# Patient Record
Sex: Male | Born: 1970 | Race: Black or African American | Hispanic: No | Marital: Married | Smoking: Current every day smoker
Health system: Southern US, Community
[De-identification: ages and names within clinical notes are randomized; demographics above are authoritative.]

## PROBLEM LIST (undated history)

## (undated) HISTORY — PX: KNEE SURGERY: SHX244

---

## 2012-06-21 ENCOUNTER — Encounter (HOSPITAL_COMMUNITY): Payer: Self-pay | Admitting: Emergency Medicine

## 2012-06-21 ENCOUNTER — Emergency Department (HOSPITAL_COMMUNITY): Payer: Private Health Insurance - Indemnity

## 2012-06-21 ENCOUNTER — Emergency Department (HOSPITAL_COMMUNITY)
Admission: EM | Admit: 2012-06-21 | Discharge: 2012-06-21 | Disposition: A | Discharge status: Home / Self Care | Payer: Private Health Insurance - Indemnity | Attending: Emergency Medicine | Admitting: Emergency Medicine

## 2012-06-21 DIAGNOSIS — F17200 Nicotine dependence, unspecified, uncomplicated: Secondary | ICD-10-CM | POA: Insufficient documentation

## 2012-06-21 DIAGNOSIS — X500XXA Overexertion from strenuous movement or load, initial encounter: Secondary | ICD-10-CM | POA: Insufficient documentation

## 2012-06-21 DIAGNOSIS — S93409A Sprain of unspecified ligament of unspecified ankle, initial encounter: Secondary | ICD-10-CM | POA: Insufficient documentation

## 2012-06-21 DIAGNOSIS — Y998 Other external cause status: Secondary | ICD-10-CM | POA: Insufficient documentation

## 2012-06-21 DIAGNOSIS — Y9367 Activity, basketball: Secondary | ICD-10-CM | POA: Insufficient documentation

## 2012-06-21 MED ORDER — OXYCODONE-ACETAMINOPHEN 5-325 MG PO TABS
2.0000 | ORAL_TABLET | Freq: Once | ORAL | Status: AC
  Administered 2012-06-21: 2 via ORAL
  Filled 2012-06-21: qty 2

## 2012-06-21 MED ORDER — TRAMADOL HCL 50 MG PO TABS: 50.0000 mg | ORAL_TABLET | Freq: Four times a day (QID) | ORAL | Status: AC | PRN

## 2012-06-21 NOTE — ED Provider Notes (Signed)
History     CSN: [id number]  Arrival date & time 06/21/12  4098   First MD Initiated Contact with Patient 06/21/12 [phone]      Chief Complaint  Patient presents with  . Ankle Pain    (Consider location/radiation/quality/duration/timing/severity/associated sxs/prior treatment) HPI  41 y/o Male in no acute distress complaining of pain and paresthesia to left ankle status post rolling the ankle 4 days ago while playing basketball. Patient has difficulty ambulating, urine at this point using crutches. pain is 10 out of 10 not relieved by Tylenol.   History reviewed. No pertinent past medical history.  History reviewed. No pertinent past surgical history.  History reviewed. No pertinent family history.  History  Substance Use Topics  . Smoking status: Current Everyday Smoker -- 0.5 packs/day for 10 years    Types: Cigarettes  . Smokeless tobacco: Not on file  . Alcohol Use: 2.4 oz/week    4 Cans of beer per week      Review of Systems  Musculoskeletal: Positive for joint swelling.  All other systems reviewed and are negative.    Allergies  Ibuprofen  Home Medications   Current Outpatient Rx  Name Route Sig Dispense Refill  . ACETAMINOPHEN 500 MG PO TABS Oral Take 500 mg by mouth every 6 (six) hours as needed. pain      BP 132/75  Pulse 82  Temp 98.3 F (36.8 C) (Oral)  Resp 20  SpO2 100%  Physical Exam  Nursing note and vitals reviewed. Constitutional: He is oriented to person, place, and time. He appears well-developed and well-nourished. No distress.  HENT:  Head: Normocephalic.  Eyes: Conjunctivae and EOM are normal.  Cardiovascular: Normal rate.   Pulmonary/Chest: Effort normal.  Musculoskeletal: Normal range of motion. He exhibits edema.       Swelling to the left lateral malleolus dorsalis pedis 2+ bilaterally. Distal sensation intact to pinprick and soft touch.  Neurological: He is alert and oriented to person, place, and time.  Psychiatric: He  has a normal mood and affect.    ED Course  Procedures (including critical care time)  Labs Reviewed - No data to display Dg Ankle Complete Left  06/21/2012  *RADIOLOGY REPORT*  Clinical Data: Basketball injury, ankle pain/swelling  LEFT ANKLE COMPLETE - 3+ VIEW  Comparison: None.  Findings: No fracture or dislocation is seen.  Degenerative changes of the tibiotalar joint, possibly related to prior trauma.  The ankle mortise is intact.  The base of the fifth metatarsal is unremarkable.  Mild soft tissue swelling, more prominent laterally.  IMPRESSION: No fracture or dislocation is seen.  Degenerative changes of the tibiotalar joint.  Mild soft tissue swelling.  Original Report Authenticated By: Charline Bills, M.D.     1. Ankle sprain       MDM  Male presenting with ankle pain and swelling after rolling the ankle 4 days ago while playing basketball. Pain is not improving he is now ambulating with crutches. To rule out fracture and control pain with Percocet.  No Fracture noted on XR. I will give Pt a new set of crutches as his are without padding. I will encourage elevation and ace-wrap to decrease swelling. I will give ortho follow up with Dr. Ave Filter.    Pt verbalized understanding and agrees with care plan. Outpatient follow-up and return precautions given.         Wynetta Emery, PA-C 06/21/12 [phone]

## 2012-06-21 NOTE — ED Notes (Signed)
Patient advises that he was playing basketball and he jumped up and when he came down he rolled his left ankle. Positive PMS edema present patient advises that he can not bear any weight on his left foot

## 2012-06-21 NOTE — ED Notes (Signed)
Patient discharged with wife using the teach back method patient and wife verbalizes an understanding.

## 2012-06-21 NOTE — ED Provider Notes (Signed)
Medical screening examination/treatment/procedure(s) were performed by non-physician practitioner and as supervising physician I was immediately available for consultation/collaboration.   Gwyneth Sprout, MD 06/21/12 1246

## 2012-06-21 NOTE — ED Notes (Signed)
ED provider in to see patient. °

## 2012-06-21 NOTE — Progress Notes (Signed)
Orthopedic Tech Progress Note Patient Details:  Mathew Craig 1971-02-26 [id number]  Ortho Devices Type of Ortho Device: Crutches;Ace wrap Ortho Device/Splint Interventions: Application   Cammer, Mickie Bail 06/21/2012, 9:37 AM

## 2012-06-21 NOTE — ED Notes (Signed)
Patient transported to X-ray 

## 2012-07-24 ENCOUNTER — Other Ambulatory Visit: Payer: Self-pay | Admitting: Podiatry

## 2012-07-24 DIAGNOSIS — M79672 Pain in left foot: Secondary | ICD-10-CM

## 2012-07-24 DIAGNOSIS — M25572 Pain in left ankle and joints of left foot: Secondary | ICD-10-CM

## 2012-07-30 ENCOUNTER — Inpatient Hospital Stay (HOSPITAL_COMMUNITY): Admission: RE | Admit: 2012-07-30 | Payer: Private Health Insurance - Indemnity | Source: Ambulatory Visit

## 2012-07-30 ENCOUNTER — Ambulatory Visit (HOSPITAL_COMMUNITY): Admission: RE | Admit: 2012-07-30 | Payer: Private Health Insurance - Indemnity | Source: Ambulatory Visit

## 2012-07-30 ENCOUNTER — Other Ambulatory Visit (HOSPITAL_COMMUNITY): Payer: Private Health Insurance - Indemnity

## 2012-08-01 ENCOUNTER — Ambulatory Visit (HOSPITAL_COMMUNITY)
Admission: RE | Admit: 2012-08-01 | Discharge: 2012-08-01 | Disposition: A | Discharge status: Home / Self Care | Payer: Private Health Insurance - Indemnity | Source: Ambulatory Visit | Attending: Podiatry | Admitting: Podiatry

## 2012-08-01 DIAGNOSIS — M25579 Pain in unspecified ankle and joints of unspecified foot: Secondary | ICD-10-CM | POA: Insufficient documentation

## 2012-08-01 DIAGNOSIS — X58XXXA Exposure to other specified factors, initial encounter: Secondary | ICD-10-CM | POA: Insufficient documentation

## 2012-08-01 DIAGNOSIS — M79609 Pain in unspecified limb: Secondary | ICD-10-CM | POA: Insufficient documentation

## 2012-08-01 DIAGNOSIS — M25476 Effusion, unspecified foot: Secondary | ICD-10-CM | POA: Insufficient documentation

## 2012-08-01 DIAGNOSIS — M25473 Effusion, unspecified ankle: Secondary | ICD-10-CM | POA: Insufficient documentation

## 2012-08-01 DIAGNOSIS — S93699A Other sprain of unspecified foot, initial encounter: Secondary | ICD-10-CM | POA: Insufficient documentation

## 2012-08-01 DIAGNOSIS — M659 Synovitis and tenosynovitis, unspecified: Secondary | ICD-10-CM | POA: Insufficient documentation

## 2012-08-01 DIAGNOSIS — M65979 Unspecified synovitis and tenosynovitis, unspecified ankle and foot: Secondary | ICD-10-CM | POA: Insufficient documentation

## 2012-08-01 DIAGNOSIS — R609 Edema, unspecified: Secondary | ICD-10-CM | POA: Insufficient documentation

## 2012-09-12 ENCOUNTER — Encounter (HOSPITAL_COMMUNITY): Payer: Self-pay | Admitting: *Deleted

## 2012-09-12 ENCOUNTER — Emergency Department (HOSPITAL_COMMUNITY)
Admission: EM | Admit: 2012-09-12 | Discharge: 2012-09-12 | Disposition: A | Discharge status: Home / Self Care | Payer: Private Health Insurance - Indemnity | Attending: Emergency Medicine | Admitting: Emergency Medicine

## 2012-09-12 ENCOUNTER — Emergency Department (HOSPITAL_COMMUNITY): Payer: Private Health Insurance - Indemnity

## 2012-09-12 DIAGNOSIS — R51 Headache: Secondary | ICD-10-CM | POA: Insufficient documentation

## 2012-09-12 DIAGNOSIS — S01309A Unspecified open wound of unspecified ear, initial encounter: Secondary | ICD-10-CM | POA: Insufficient documentation

## 2012-09-12 DIAGNOSIS — Z23 Encounter for immunization: Secondary | ICD-10-CM | POA: Insufficient documentation

## 2012-09-12 DIAGNOSIS — W1809XA Striking against other object with subsequent fall, initial encounter: Secondary | ICD-10-CM | POA: Insufficient documentation

## 2012-09-12 DIAGNOSIS — W19XXXA Unspecified fall, initial encounter: Secondary | ICD-10-CM

## 2012-09-12 DIAGNOSIS — S01319A Laceration without foreign body of unspecified ear, initial encounter: Secondary | ICD-10-CM

## 2012-09-12 DIAGNOSIS — Y9289 Other specified places as the place of occurrence of the external cause: Secondary | ICD-10-CM | POA: Insufficient documentation

## 2012-09-12 DIAGNOSIS — Y9389 Activity, other specified: Secondary | ICD-10-CM | POA: Insufficient documentation

## 2012-09-12 DIAGNOSIS — F17200 Nicotine dependence, unspecified, uncomplicated: Secondary | ICD-10-CM | POA: Insufficient documentation

## 2012-09-12 MED ORDER — TETANUS-DIPHTH-ACELL PERTUSSIS 5-2.5-18.5 LF-MCG/0.5 IM SUSP
0.5000 mL | Freq: Once | INTRAMUSCULAR | Status: AC
  Administered 2012-09-12: 0.5 mL via INTRAMUSCULAR
  Filled 2012-09-12: qty 0.5

## 2012-09-12 NOTE — ED Provider Notes (Signed)
History     CSN: [id number]  Arrival date & time 09/12/12  4098   First MD Initiated Contact with Patient 09/12/12 2001      Chief Complaint  Patient presents with  . Fall    (Consider location/radiation/quality/duration/timing/severity/associated sxs/prior treatment) HPI Comments: 41 year old male presents the emergency department with his wife with the right ear laceration after slipping and falling in the shower and hitting his ear on the toilet seat causing the toilet seat to break later this evening. His wife her to fall and ran into the bathroom and states he was "out" for a few minutes. Admits to drinking alcohol prior to incident. Currently complaining of a headache. No confusion, dizziness, nausea or vomiting. Wife states this is the third time he has had this year and has noticed speech changes such as unable to form complete sentences and some slurring. She is very concerned that there is something wrong in his head. Unsure of last tetanus shot.  Patient is a 41 y.o. male presenting with fall. The history is provided by the patient and the spouse.  Fall Associated symptoms include headaches. Pertinent negatives include no nausea.    History reviewed. No pertinent past medical history.  History reviewed. No pertinent past surgical history.  No family history on file.  History  Substance Use Topics  . Smoking status: Current Every Day Smoker -- 0.5 packs/day for 10 years    Types: Cigarettes  . Smokeless tobacco: Not on file  . Alcohol Use: 2.4 oz/week    4 Cans of beer per week      Review of Systems  Constitutional: Positive for activity change.  HENT: Negative for neck pain and neck stiffness.   Eyes: Negative for visual disturbance.  Gastrointestinal: Negative for nausea.  Musculoskeletal: Negative for back pain.  Skin: Positive for wound.  Neurological: Positive for headaches. Negative for dizziness.       Possible LOC  Psychiatric/Behavioral: Negative  for confusion and self-injury.    Allergies  Ibuprofen  Home Medications   Current Outpatient Rx  Name Route Sig Dispense Refill  . ACETAMINOPHEN 500 MG PO TABS Oral Take 500 mg by mouth every 6 (six) hours as needed. pain      BP 150/83  Pulse 99  Temp 98.2 F (36.8 C) (Oral)  Resp 22  SpO2 95%  Physical Exam  Nursing note and vitals reviewed. Constitutional: He is oriented to person, place, and time.  HENT:  Head: Normocephalic and atraumatic.  Ears:  Eyes: Conjunctivae normal and EOM are normal. Pupils are equal, round, and reactive to light. No scleral icterus.  Neck: Normal range of motion. Neck supple.  Cardiovascular: Normal rate, regular rhythm, normal heart sounds and intact distal pulses.   Pulmonary/Chest: Effort normal and breath sounds normal.  Musculoskeletal: Normal range of motion. He exhibits no edema.  Neurological: He is alert and oriented to person, place, and time. No cranial nerve deficit. Coordination normal.  Skin: Skin is warm and dry. Laceration noted.  Psychiatric: He has a normal mood and affect. His behavior is normal. Thought content normal. His speech is rapid and/or pressured. Cognition and memory are normal.    ED Course  Procedures (including critical care time) LACERATION REPAIR Performed by: Johnnette Gourd Authorized by: Johnnette Gourd Consent: Verbal consent obtained. Risks and benefits: risks, benefits and alternatives were discussed Consent given by: patient Patient identity confirmed: provided demographic data Prepped and Draped in normal sterile fashion Wound explored  Laceration Location: right ear  Laceration Length: 1.5 cm flap  No Foreign Bodies seen or palpated  Anesthesia: local infiltration  Local anesthetic: lidocaine 2% without epinephrine  Anesthetic total: 3 ml  Irrigation method: syringe Amount of cleaning: standard  Skin closure: 6-0 prolene  Number of sutures: 5  Technique: simple  interrupted  Patient tolerance: Patient tolerated the procedure well with no immediate complications.  Labs Reviewed - No data to display No results found.   No diagnosis found.    MDM  Ear laceration repaired without problem. Wife states concern for patient's mental status. Says his speech is different after hitting his head 3 times. No slurred speech, but very rapid and pressured. No focal neurologic deficits. Due to hx of etoh ingestion prior to fall and wife's concern, I will obtain CT scan to rule out any acute abnormality. If normal, they will f/u with his PCP. Patient will be moved to CDU to wait for scan. Return precautions discussed. Case discussed with Felicie Morn, NP in the CDU.        Trevor Mace, PA-C 09/12/12 2130

## 2012-09-12 NOTE — ED Notes (Signed)
The pt slipped and fell in the shower and struck his head. Poss loc .  Small lac to the rt era lobe,  No active bleeding. He admits to alcohol

## 2012-09-12 NOTE — ED Provider Notes (Signed)
CT results reviewed, discussed with Dr. Manus Gunning, shared with patient.  Discharge instructions for laceration care provided.  Jimmye Norman, NP 09/13/12 [phone]

## 2012-09-12 NOTE — ED Notes (Signed)
Pt states he fell, "was dazed for a minute but I jumped right back up." Pt states he "drank too much." Pt has lac to right ear, skin flap noted. Pt has lac behind right ear, bleeding controlled. Pt denies pain states "I'm numb from the alcohol." Pt admits to drinking states "I had way too many."

## 2012-09-13 NOTE — ED Provider Notes (Signed)
Medical screening examination/treatment/procedure(s) were performed by non-physician practitioner and as supervising physician I was immediately available for consultation/collaboration.   Olliver Boyadjian, MD 09/13/12 1148 

## 2012-09-13 NOTE — ED Provider Notes (Signed)
Medical screening examination/treatment/procedure(s) were performed by non-physician practitioner and as supervising physician I was immediately available for consultation/collaboration.   Glynn Octave, MD 09/13/12 1152

## 2012-09-21 ENCOUNTER — Encounter (HOSPITAL_COMMUNITY): Payer: Self-pay | Admitting: Emergency Medicine

## 2012-09-21 ENCOUNTER — Emergency Department (HOSPITAL_COMMUNITY)
Admission: EM | Admit: 2012-09-21 | Discharge: 2012-09-21 | Disposition: A | Discharge status: Home / Self Care | Payer: Medicaid Other | Attending: Emergency Medicine | Admitting: Emergency Medicine

## 2012-09-21 DIAGNOSIS — Z4802 Encounter for removal of sutures: Secondary | ICD-10-CM

## 2012-09-21 DIAGNOSIS — F17200 Nicotine dependence, unspecified, uncomplicated: Secondary | ICD-10-CM | POA: Insufficient documentation

## 2012-09-21 NOTE — ED Provider Notes (Signed)
History  This chart was scribed for Loren Racer, MD by Shari Heritage and Leone Payor, ER Scribes. The patient was seen in room TR07C/TR07C. Patient's care was started at 1030.   CSN: [id number]  Arrival date & time 09/21/12  1023   First MD Initiated Contact with Patient 09/21/12 1030      Chief Complaint  Patient presents with  . Suture / Staple Removal     Patient is a 41 y.o. male presenting with suture removal. The history is provided by the patient. No language interpreter was used.  Suture / Staple Removal  The sutures were placed 7 to 10 days ago. Treatments since wound repair include antibiotic ointment use (bacitracin ). There has been no drainage from the wound. There is no redness present. There is no swelling present. The pain has improved.    Mathew Craig is a 41 y.o. male who presents to the Emergency Department requesting removal of stitches from right ear placed 9 days ago. Pt reports mild pain to the area but denies any other pain or symptoms. On 09/12/12, pt was given stitches for a right ear laceration after slipping in the shower and hitting his ear on the toilet seat. Pt is an everyday smoker and uses alcohol.    History reviewed. No pertinent past medical history.  History reviewed. No pertinent past surgical history.  No family history on file.  History  Substance Use Topics  . Smoking status: Current Every Day Smoker -- 0.5 packs/day for 10 years    Types: Cigarettes  . Smokeless tobacco: Not on file  . Alcohol Use: 2.4 oz/week    4 Cans of beer per week      Review of Systems  Constitutional: Negative for fever.  Gastrointestinal: Negative for nausea and vomiting.  Skin:       Positive for healing laceration to right ear.      Allergies  Ibuprofen  Home Medications   Current Outpatient Rx  Name  Route  Sig  Dispense  Refill  . ACETAMINOPHEN 500 MG PO TABS   Oral   Take 500 mg by mouth every 6 (six) hours as needed. pain             BP 141/76  Pulse 82  Temp 97.8 F (36.6 C) (Oral)  Resp 16  SpO2 94%  Physical Exam  Nursing note and vitals reviewed. Constitutional: He is oriented to person, place, and time. He appears well-developed and well-nourished. No distress.  HENT:  Head: Normocephalic and atraumatic.  Eyes: EOM are normal.  Neck: Neck supple. No tracheal deviation present.  Cardiovascular: Normal rate.   Pulmonary/Chest: Effort normal. No respiratory distress.  Musculoskeletal: Normal range of motion.  Neurological: He is alert and oriented to person, place, and time.  Skin: Skin is warm and dry.       No evidence of infection.  Mild tenderness at site of injury.   Psychiatric: He has a normal mood and affect. His behavior is normal.    ED Course  Procedures (including critical care time)  COORDINATION OF CARE:  10:48 AM Patient informed of current plan for treatment and evaluation and agrees with plan at this time.     Labs Reviewed - No data to display No results found.   1. Visit for suture removal       MDM  I personally performed the services described in this documentation, which was scribed in my presence. The recorded information has been reviewed and  is accurate.  F/u with plastics as needed for scar/wound revision    Loren Racer, MD 09/21/12 [phone]

## 2013-06-19 ENCOUNTER — Encounter (HOSPITAL_COMMUNITY): Payer: Self-pay | Admitting: Nurse Practitioner

## 2013-06-19 ENCOUNTER — Emergency Department (HOSPITAL_COMMUNITY)
Admission: EM | Admit: 2013-06-19 | Discharge: 2013-06-19 | Disposition: A | Discharge status: Home / Self Care | Payer: Private Health Insurance - Indemnity | Attending: Emergency Medicine | Admitting: Emergency Medicine

## 2013-06-19 ENCOUNTER — Emergency Department (HOSPITAL_COMMUNITY): Payer: Private Health Insurance - Indemnity

## 2013-06-19 DIAGNOSIS — F17200 Nicotine dependence, unspecified, uncomplicated: Secondary | ICD-10-CM | POA: Insufficient documentation

## 2013-06-19 DIAGNOSIS — Y9389 Activity, other specified: Secondary | ICD-10-CM | POA: Insufficient documentation

## 2013-06-19 DIAGNOSIS — M25522 Pain in left elbow: Secondary | ICD-10-CM

## 2013-06-19 DIAGNOSIS — X500XXA Overexertion from strenuous movement or load, initial encounter: Secondary | ICD-10-CM | POA: Insufficient documentation

## 2013-06-19 DIAGNOSIS — S6990XA Unspecified injury of unspecified wrist, hand and finger(s), initial encounter: Secondary | ICD-10-CM | POA: Insufficient documentation

## 2013-06-19 DIAGNOSIS — IMO0002 Reserved for concepts with insufficient information to code with codable children: Secondary | ICD-10-CM | POA: Insufficient documentation

## 2013-06-19 DIAGNOSIS — S59909A Unspecified injury of unspecified elbow, initial encounter: Secondary | ICD-10-CM | POA: Insufficient documentation

## 2013-06-19 DIAGNOSIS — Y929 Unspecified place or not applicable: Secondary | ICD-10-CM | POA: Insufficient documentation

## 2013-06-19 MED ORDER — PREDNISONE 20 MG PO TABS: 40.0000 mg | ORAL_TABLET | Freq: Every day | ORAL | Status: DC

## 2013-06-19 MED ORDER — TRAMADOL HCL 50 MG PO TABS: 50.0000 mg | ORAL_TABLET | Freq: Three times a day (TID) | ORAL | Status: DC | PRN

## 2013-06-19 NOTE — ED Provider Notes (Signed)
CSN: [id number]     Arrival date & time 06/19/13  1044 History     First MD Initiated Contact with Patient 06/19/13 1059     Chief Complaint  Patient presents with  . Elbow Pain   (Consider location/radiation/quality/duration/timing/severity/associated sxs/prior Treatment) HPI Comments: Patient is a 42 y/o male with no significant PMH who presents for L elbow pain with onset 2 weeks ago. Patient states he was bench pressing 350 lbs when he felt a pop in his L elbow followed by sudden onset of pain. Patient described the pain as an aching sensation and states that it does not radiate. Has tried tylenol as well as ice and ACE wrap without relief of symptoms. Patient denies fever, numbness/tingling, extremity weakness, pallor, and erythema.  The history is provided by the patient. No language interpreter was used.    History reviewed. No pertinent past medical history. Past Surgical History  Procedure Laterality Date  . Knee surgery     History reviewed. No pertinent family history. History  Substance Use Topics  . Smoking status: Current Every Day Smoker -- 0.50 packs/day for 10 years    Types: Cigarettes  . Smokeless tobacco: Not on file  . Alcohol Use: 2.4 oz/week    4 Cans of beer per week    Review of Systems  Constitutional: Negative for fever.  Musculoskeletal: Positive for joint swelling (mild) and arthralgias.  Skin: Negative for color change and pallor.  Neurological: Negative for weakness and numbness.  All other systems reviewed and are negative.    Allergies  Ibuprofen  Home Medications   Current Outpatient Rx  Name  Route  Sig  Dispense  Refill  . acetaminophen (TYLENOL) 500 MG tablet   Oral   Take 500 mg by mouth every 6 (six) hours as needed. pain         . cyclobenzaprine (FLEXERIL) 10 MG tablet   Oral   Take 10 mg by mouth 3 (three) times daily as needed for muscle spasms.         . predniSONE (DELTASONE) 20 MG tablet   Oral   Take 2 tablets  (40 mg total) by mouth daily.   10 tablet   0   . traMADol (ULTRAM) 50 MG tablet   Oral   Take 1 tablet (50 mg total) by mouth every 8 (eight) hours as needed for pain.   15 tablet   0    BP 136/74  Pulse 64  Temp(Src) 98.2 F (36.8 C) (Oral)  Resp 16  Ht 5\' 10"  (1.778 m)  Wt 215 lb (97.523 kg)  BMI 30.85 kg/m2  SpO2 98%  Physical Exam  Nursing note and vitals reviewed. Constitutional: He is oriented to person, place, and time. He appears well-developed and well-nourished. No distress.  HENT:  Head: Normocephalic and atraumatic.  Eyes: Conjunctivae and EOM are normal. No scleral icterus.  Neck: Normal range of motion.  Cardiovascular: Normal rate, regular rhythm and intact distal pulses.   Distal radial pulses 2+ bilaterally. Capillary refill normal.  Pulmonary/Chest: Effort normal. No respiratory distress.  Musculoskeletal:       Left elbow: He exhibits decreased range of motion (mild, secondary to pain) and swelling (Mild). He exhibits no effusion, no deformity and no laceration. Tenderness found. Medial epicondyle and olecranon process tenderness noted. No radial head and no lateral epicondyle tenderness noted.  Patient exhibits 5 out of 5 strength against resistance with flexion and extension of left elbow joint  Neurological: He is  alert and oriented to person, place, and time.  No sensory or motor deficits appreciated.  Skin: Skin is warm and dry. No rash noted. He is not diaphoretic. No erythema. No pallor.  Psychiatric: He has a normal mood and affect. His behavior is normal.    ED Course   Procedures (including critical care time)  Labs Reviewed - No data to display Dg Elbow Complete Left  06/19/2013   *RADIOLOGY REPORT*  Clinical Data: Left elbow injury and pain.  LEFT ELBOW - COMPLETE 3+ VIEW  Comparison: None  Findings: No evidence of acute fracture, subluxation or dislocation identified.  No joint effusion noted.  No radio-opaque foreign bodies are present.   No focal bony lesions are noted.  The joint spaces are unremarkable.  IMPRESSION: No evidence of acute abnormality   Original Report Authenticated By: Harmon Pier, M.D.   1. Left elbow pain    MDM  42 year old male presents for left elbow pain which has been persisting x2 weeks. Pain began after bench pressing 350 pounds at the gym. Physical exam as above. Patient neurovascularly intact; strength against resistance intact. There is mild swelling, but no erythema or heat-to-touch to suspect cellulitic or infectious process. X-ray obtained which shows no evidence of fracture, subluxation or dislocation, or joint effusion. Patient appropriate for discharge with orthopedic followup for further evaluation of symptoms. Patient advised to continue with rest, ice to the affected area, elevation, and compression with Ace wrap. Tylenol Effexor mild to moderate pain and prescription for tramadol given for severe pain control. Indications for ED return discussed and patient agreeable to plan.   Antony Madura, PA-C 06/20/13 1651

## 2013-06-19 NOTE — ED Notes (Addendum)
Pt was lifting heavy weights 2 weeks ago and "felt a pop" in L elbow, persistent L elbow pain since. Using otc pain meds and ice at home with no relief of pain. Also thinks he has a stitch from last year remaining to top of R ear, his wife saw it there "and it looks like a blue stitch."

## 2013-06-21 NOTE — ED Provider Notes (Signed)
Medical screening examination/treatment/procedure(s) were performed by non-physician practitioner and as supervising physician I was immediately available for consultation/collaboration.  Michille Mcelrath L Monic Engelmann, MD 06/21/13 1012 

## 2013-12-13 ENCOUNTER — Encounter (HOSPITAL_COMMUNITY): Payer: Self-pay | Admitting: Emergency Medicine

## 2013-12-13 ENCOUNTER — Emergency Department (HOSPITAL_COMMUNITY)
Admission: EM | Admit: 2013-12-13 | Discharge: 2013-12-13 | Disposition: A | Discharge status: Home / Self Care | Payer: Medicaid Other | Attending: Emergency Medicine | Admitting: Emergency Medicine

## 2013-12-13 ENCOUNTER — Emergency Department (HOSPITAL_COMMUNITY): Payer: Medicaid Other

## 2013-12-13 DIAGNOSIS — M25529 Pain in unspecified elbow: Secondary | ICD-10-CM

## 2013-12-13 DIAGNOSIS — G8929 Other chronic pain: Secondary | ICD-10-CM | POA: Insufficient documentation

## 2013-12-13 DIAGNOSIS — IMO0002 Reserved for concepts with insufficient information to code with codable children: Secondary | ICD-10-CM | POA: Insufficient documentation

## 2013-12-13 DIAGNOSIS — Z791 Long term (current) use of non-steroidal anti-inflammatories (NSAID): Secondary | ICD-10-CM | POA: Insufficient documentation

## 2013-12-13 DIAGNOSIS — M109 Gout, unspecified: Secondary | ICD-10-CM

## 2013-12-13 DIAGNOSIS — F17200 Nicotine dependence, unspecified, uncomplicated: Secondary | ICD-10-CM | POA: Insufficient documentation

## 2013-12-13 MED ORDER — INDOMETHACIN 25 MG PO CAPS
50.0000 mg | ORAL_CAPSULE | Freq: Once | ORAL | Status: AC
  Administered 2013-12-13: 50 mg via ORAL
  Filled 2013-12-13: qty 2

## 2013-12-13 MED ORDER — OXYCODONE-ACETAMINOPHEN 5-325 MG PO TABS: 1.0000 | ORAL_TABLET | Freq: Four times a day (QID) | ORAL | Status: DC | PRN

## 2013-12-13 MED ORDER — OXYCODONE-ACETAMINOPHEN 5-325 MG PO TABS
1.0000 | ORAL_TABLET | Freq: Once | ORAL | Status: AC
  Administered 2013-12-13: 1 via ORAL
  Filled 2013-12-13: qty 1

## 2013-12-13 MED ORDER — INDOMETHACIN 50 MG PO CAPS: 50.0000 mg | ORAL_CAPSULE | Freq: Two times a day (BID) | ORAL | Status: DC

## 2013-12-13 NOTE — ED Provider Notes (Signed)
CSN: [id number]     Arrival date & time 12/13/13  1042 History   First MD Initiated Contact with Patient 12/13/13 1122    This chart was scribed for Mathew Craig, a non-physician practitioner working with Mathew Lyons, MD by Mathew Craig, ED Scribe. This patient was seen in room TR09C/TR09C and the patient's care was started at 11:27 AM     Chief Complaint  Patient presents with  . Elbow Pain    left elbow  . Foot Pain    right foot   (Consider location/radiation/quality/duration/timing/severity/associated sxs/prior Treatment) The history is provided by the patient. No language interpreter was used.    HPI Comments: Mathew Craig is a 43 y.o. male who presents to the Emergency Department complaining of waxing and waning worsening right foot, and right ankle pain onset chronic (over a year with no precipitating factors). Describes pain in right foot and right ankle as worsening. Reports associated worsening swelling of right foot. Reports pain and swelling is exacerbated by weight lifting and alleviated by nothing. Denies associated numbness, recent trauma, weakness, and fever. Denies known PMHx of gout.   Additionally, reports chronic left elbow pain. Describes pain of left elbow as mild and improving. Reports possible initial injury of left elbow while "bench pressing". Reports associated improving swelling of left foot. Reports pain is exacerbated with overuse. Denies any alleviating factors.   History reviewed. No pertinent past medical history. Past Surgical History  Procedure Laterality Date  . Knee surgery     No family history on file. History  Substance Use Topics  . Smoking status: Current Every Day Smoker -- 0.50 packs/day for 10 years    Types: Cigarettes  . Smokeless tobacco: Not on file  . Alcohol Use: 2.4 oz/week    4 Cans of beer per week    Review of Systems  Constitutional: Negative for fever.  Musculoskeletal: Positive for arthralgias and myalgias.   Psychiatric/Behavioral: Negative for confusion.    Allergies  Ibuprofen  Home Medications   Current Outpatient Rx  Name  Route  Sig  Dispense  Refill  . acetaminophen (TYLENOL) 500 MG tablet   Oral   Take 500 mg by mouth every 6 (six) hours as needed. pain         . cyclobenzaprine (FLEXERIL) 10 MG tablet   Oral   Take 10 mg by mouth 3 (three) times daily as needed for muscle spasms.         . indomethacin (INDOCIN) 50 MG capsule   Oral   Take 1 capsule (50 mg total) by mouth 2 (two) times daily with a meal.   30 capsule   0   . oxyCODONE-acetaminophen (PERCOCET/ROXICET) 5-325 MG per tablet   Oral   Take 1-2 tablets by mouth every 6 (six) hours as needed for severe pain.   20 tablet   0   . predniSONE (DELTASONE) 20 MG tablet   Oral   Take 2 tablets (40 mg total) by mouth daily.   10 tablet   0   . traMADol (ULTRAM) 50 MG tablet   Oral   Take 1 tablet (50 mg total) by mouth every 8 (eight) hours as needed for pain.   15 tablet   0    BP 132/67  Pulse 88  Temp(Src) 98.7 F (37.1 C) (Oral)  Resp 18  Ht 5\' 10"  (1.778 m)  Wt 225 lb (102.059 kg)  BMI 32.28 kg/m2  SpO2 96% Physical Exam  Nursing note and  vitals reviewed. Constitutional: He is oriented to person, place, and time. He appears well-developed and well-nourished. No distress.  HENT:  Head: Normocephalic and atraumatic.  Eyes: EOM are normal.  Neck: Neck supple. No tracheal deviation present.  Cardiovascular: Normal rate.   Pulmonary/Chest: Effort normal. No respiratory distress.  Musculoskeletal: Normal range of motion. He exhibits no tenderness.  Neurological: He is alert and oriented to person, place, and time.  Skin: Skin is warm and dry.  Indurated and tenderness to light touch or right ankle and 4th and 5th toes of right foot  Psychiatric: He has a normal mood and affect. His behavior is normal.    ED Course  Procedures  COORDINATION OF CARE:  Nursing notes reviewed. Vital signs  reviewed. Initial pt interview and examination performed.   11:27 AM-Discussed work up plan with pt at bedside, which includes x-ray of left elbow, x-ray of right foot, and x-ray right ankle. Pt agrees with plan.  12:25 PM Nursing Notes Reviewed/ Care Coordinated Applicable Imaging Reviewed  Interpretation of Laboratory Data incorporated into ED treatment Discussed results and treatment plan with pt. Pt demonstrates understanding and agrees with plan.  Treatment plan initiated: Medications  indomethacin (INDOCIN) capsule 50 mg (not administered)  oxyCODONE-acetaminophen (PERCOCET/ROXICET) 5-325 MG per tablet 1 tablet (not administered)     Initial diagnostic testing ordered.     Labs Review Labs Reviewed - No data to display Imaging Review Dg Elbow 2 Views Left  12/13/2013   CLINICAL DATA:  Left posterior elbow pain without a history of injury.  EXAM: LEFT ELBOW - 2 VIEW  COMPARISON:  06/19/2013  FINDINGS: Two view exam shows no evidence for fracture. No subluxation or dislocation. No fat pad elevation suggest joint effusion. Mild degenerative changes are seen in the humeral head.  IMPRESSION: No acute bony abnormality.  No evidence of joint effusion.   Electronically Signed   By: Kennith Center M.D.   On: 12/13/2013 12:12   Dg Ankle Complete Right  12/13/2013   CLINICAL DATA:  Ankle pain without a history of injury.  EXAM: RIGHT ANKLE - COMPLETE 3+ VIEW  COMPARISON:  None.  FINDINGS: There is no evidence for fracture, subluxation or dislocation. No worrisome lytic or sclerotic osseous lesion. .  IMPRESSION: Normal exam.   Electronically Signed   By: Kennith Center M.D.   On: 12/13/2013 12:13   Dg Foot Complete Right  12/13/2013   CLINICAL DATA:  Ankle pain without a history of injury.  EXAM: RIGHT FOOT COMPLETE - 3+ VIEW  COMPARISON:  None.  FINDINGS: There is no evidence for an acute fracture. Bony alignment is anatomic. No worrisome lytic or sclerotic osseous lesion.  IMPRESSION: Normal  exam.   Electronically Signed   By: Kennith Center M.D.   On: 12/13/2013 12:13    EKG Interpretation   None       MDM   1. Gout of foot   2. Elbow pain    Percocet and Indomethacin for goutty attack. Referral to Ortho for elbow  42 y.o.Mathew Craig's evaluation in the Emergency Department is complete. It has been determined that no acute conditions requiring further emergency intervention are present at this time. The patient/guardian have been advised of the diagnosis and plan. We have discussed signs and symptoms that warrant return to the ED, such as changes or worsening in symptoms.  Vital signs are stable at discharge. Filed Vitals:   12/13/13 1118  BP: 132/67  Pulse: 88  Temp: 98.7 F (  37.1 C)  Resp: 18    Patient/guardian has voiced understanding and agreed to follow-up with the PCP or specialist.  I personally performed the services described in this documentation, which was scribed in my presence. The recorded information has been reviewed and is accurate.    Dorthula Matasiffany G Beverly Ferner, PA-C 12/13/13 1227

## 2013-12-13 NOTE — ED Notes (Signed)
Pt c/o pain and swelling left elbow and right foot and ankle. Pt denies recent injury. Pt presents using crutches for walking.

## 2013-12-13 NOTE — ED Provider Notes (Signed)
Medical screening examination/treatment/procedure(s) were performed by non-physician practitioner and as supervising physician I was immediately available for consultation/collaboration.     Mathew Lyonsouglas Gursimran Litaker, MD 12/13/13 [phone]

## 2013-12-13 NOTE — Discharge Instructions (Signed)
Gout °Gout is an inflammatory arthritis caused by a buildup of uric acid crystals in the joints. Uric acid is a chemical that is normally present in the blood. When the level of uric acid in the blood is too high it can form crystals that deposit in your joints and tissues. This causes joint redness, soreness, and swelling (inflammation). Repeat attacks are common. Over time, uric acid crystals can form into masses (tophi) near a joint, destroying bone and causing disfigurement. Gout is treatable and often preventable. °CAUSES  °The disease begins with elevated levels of uric acid in the blood. Uric acid is produced by your body when it breaks down a naturally found substance called purines. Certain foods you eat, such as meats and fish, contain high amounts of purines. Causes of an elevated uric acid level include: °· Being passed down from parent to child (heredity). °· Diseases that cause increased uric acid production (such as obesity, psoriasis, and certain cancers). °· Excessive alcohol use. °· Diet, especially diets rich in meat and seafood. °· Medicines, including certain cancer-fighting medicines (chemotherapy), water pills (diuretics), and aspirin. °· Chronic kidney disease. The kidneys are no longer able to remove uric acid well. °· Problems with metabolism. °Conditions strongly associated with gout include: °· Obesity. °· High blood pressure. °· High cholesterol. °· Diabetes. °Not everyone with elevated uric acid levels gets gout. It is not understood why some people get gout and others do not. Surgery, joint injury, and eating too much of certain foods are some of the factors that can lead to gout attacks. °SYMPTOMS  °· An attack of gout comes on quickly. It causes intense pain with redness, swelling, and warmth in a joint. °· Fever can occur. °· Often, only one joint is involved. Certain joints are more commonly involved: °· Base of the big toe. °· Knee. °· Ankle. °· Wrist. °· Finger. °Without  treatment, an attack usually goes away in a few days to weeks. Between attacks, you usually will not have symptoms, which is different from many other forms of arthritis. °DIAGNOSIS  °Your caregiver will suspect gout based on your symptoms and exam. In some cases, tests may be recommended. The tests may include: °· Blood tests. °· Urine tests. °· X-rays. °· Joint fluid exam. This exam requires a needle to remove fluid from the joint (arthrocentesis). Using a microscope, gout is confirmed when uric acid crystals are seen in the joint fluid. °TREATMENT  °There are two phases to gout treatment: treating the sudden onset (acute) attack and preventing attacks (prophylaxis). °· Treatment of an Acute Attack. °· Medicines are used. These include anti-inflammatory medicines or steroid medicines. °· An injection of steroid medicine into the affected joint is sometimes necessary. °· The painful joint is rested. Movement can worsen the arthritis. °· You may use warm or cold treatments on painful joints, depending which works best for you. °· Treatment to Prevent Attacks. °· If you suffer from frequent gout attacks, your caregiver may advise preventive medicine. These medicines are started after the acute attack subsides. These medicines either help your kidneys eliminate uric acid from your body or decrease your uric acid production. You may need to stay on these medicines for a very long time. °· The early phase of treatment with preventive medicine can be associated with an increase in acute gout attacks. For this reason, during the first few months of treatment, your caregiver may also advise you to take medicines usually used for acute gout treatment. Be sure you   understand your caregiver's directions. Your caregiver may make several adjustments to your medicine dose before these medicines are effective.  Discuss dietary treatment with your caregiver or dietitian. Alcohol and drinks high in sugar and fructose and foods  such as meat, poultry, and seafood can increase uric acid levels. Your caregiver or dietician can advise you on drinks and foods that should be limited. HOME CARE INSTRUCTIONS   Do not take aspirin to relieve pain. This raises uric acid levels.  Only take over-the-counter or prescription medicines for pain, discomfort, or fever as directed by your caregiver.  Rest the joint as much as possible. When in bed, keep sheets and blankets off painful areas.  Keep the affected joint raised (elevated).  Apply warm or cold treatments to painful joints. Use of warm or cold treatments depends on which works best for you.  Use crutches if the painful joint is in your leg.  Drink enough fluids to keep your urine clear or pale yellow. This helps your body get rid of uric acid. Limit alcohol, sugary drinks, and fructose drinks.  Follow your dietary instructions. Pay careful attention to the amount of protein you eat. Your daily diet should emphasize fruits, vegetables, whole grains, and fat-free or low-fat milk products. Discuss the use of coffee, vitamin C, and cherries with your caregiver or dietician. These may be helpful in lowering uric acid levels.  Maintain a healthy body weight. SEEK MEDICAL CARE IF:   You develop diarrhea, vomiting, or any side effects from medicines.  You do not feel better in 24 hours, or you are getting worse. SEEK IMMEDIATE MEDICAL CARE IF:   Your joint becomes suddenly more tender, and you have chills or a fever. MAKE SURE YOU:   Understand these instructions.  Will watch your condition.  Will get help right away if you are not doing well or get worse. Document Released: 10/25/2000 Document Revised: 02/22/2013 Document Reviewed: 06/10/2012 [facility] Patient Information 2014 [place], [place].    Arthralgia Arthralgia is joint pain. A joint is a place where two bones meet. Joint pain can happen for many reasons. The joint can be bruised, stiff, infected, or weak  from aging. Pain usually goes away after resting and taking medicine for soreness.  HOME CARE  Rest the joint as told by your doctor.  Keep the sore joint raised (elevated) for the first 24 hours.  Put ice on the joint area.  Put ice in a plastic bag.  Place a towel between your skin and the bag.  Leave the ice on for 15-20 minutes, 03-04 times a day.  Wear your splint, casting, elastic bandage, or sling as told by your doctor.  Only take medicine as told by your doctor. Do not take aspirin.  Use crutches as told by your doctor. Do not put weight on the joint until told to by your doctor. GET HELP RIGHT AWAY IF:   You have bruising, puffiness (swelling), or more pain.  Your fingers or toes turn blue or start to lose feeling (numb).  Your medicine does not lessen the pain.  Your pain becomes severe.  You have a temperature by mouth above 102 F (38.9 C), not controlled by medicine.  You cannot move or use the joint. MAKE SURE YOU:   Understand these instructions.  Will watch your condition.  Will get help right away if you are not doing well or get worse. Document Released: 10/16/2009 Document Revised: 01/20/2012 Document Reviewed: 10/16/2009 [facility] Patient Information 2014 [place], [place].

## 2015-02-09 ENCOUNTER — Ambulatory Visit (INDEPENDENT_AMBULATORY_CARE_PROVIDER_SITE_OTHER): Payer: Worker's Compensation | Admitting: Family Medicine

## 2015-02-09 ENCOUNTER — Ambulatory Visit: Payer: Self-pay

## 2015-02-09 VITALS — BP 126/74 | HR 86 | Temp 98.2°F | Resp 18

## 2015-02-09 DIAGNOSIS — M25562 Pain in left knee: Secondary | ICD-10-CM

## 2015-02-09 DIAGNOSIS — S8992XA Unspecified injury of left lower leg, initial encounter: Secondary | ICD-10-CM | POA: Diagnosis not present

## 2015-02-09 DIAGNOSIS — S86812A Strain of other muscle(s) and tendon(s) at lower leg level, left leg, initial encounter: Secondary | ICD-10-CM | POA: Diagnosis not present

## 2015-02-09 DIAGNOSIS — M25462 Effusion, left knee: Secondary | ICD-10-CM | POA: Diagnosis not present

## 2015-02-09 MED ORDER — CYCLOBENZAPRINE HCL 10 MG PO TABS: 10.0000 mg | ORAL_TABLET | Freq: Three times a day (TID) | ORAL | Status: AC | PRN

## 2015-02-09 MED ORDER — OXYCODONE-ACETAMINOPHEN 5-325 MG PO TABS: 1.0000 | ORAL_TABLET | Freq: Four times a day (QID) | ORAL | Status: AC | PRN

## 2015-02-09 NOTE — Progress Notes (Signed)
Chief Complaint:  Chief Complaint  Patient presents with  . Knee Injury    L knee, jumped off the truck  . Knee Pain  . Joint Swelling    HPI: Mathew Craig is a 44 y.o. male who is here for   Left knee pain, swelling, decreased range of motion after he jumped off his truck and felt a "pop" He cannot straighten his leg are lifted. He is able to bend it minimally. He has had a prior knee injury. Left knee at age 88 had torn a tendon before? He doe snt remember which tendon exactly He has significant pain. Pain is constant sharp rated 9 out of 10. Significant  Swelling. He is unable to move his left knee or leg. Denies any numbness or weakness.   No past medical history on file. Past Surgical History  Procedure Laterality Date  . Knee surgery     History   Social History  . Marital Status: Married    Spouse Name: N/A  . Number of Children: N/A  . Years of Education: N/A   Social History Main Topics  . Smoking status: Current Every Day Smoker -- 0.50 packs/day for 10 years    Types: Cigarettes  . Smokeless tobacco: Not on file  . Alcohol Use: 2.4 oz/week    4 Cans of beer per week  . Drug Use: No  . Sexual Activity: Not on file   Other Topics Concern  . None   Social History Narrative   No family history on file. Allergies  Allergen Reactions  . Ibuprofen Anaphylaxis   Prior to Admission medications   Medication Sig Start Date End Date Taking? Authorizing Provider  acetaminophen (TYLENOL) 500 MG tablet Take 500 mg by mouth every 6 (six) hours as needed. pain   Yes Historical Provider, MD  indomethacin (INDOCIN) 50 MG capsule Take 1 capsule (50 mg total) by mouth 2 (two) times daily with a meal. 12/13/13  Yes Marlon Pel, PA-C  cyclobenzaprine (FLEXERIL) 10 MG tablet Take 10 mg by mouth 3 (three) times daily as needed for muscle spasms.    Historical Provider, MD     ROS: The patient denies fevers, chills, night sweats, unintentional weight loss, chest  pain, palpitations, wheezing, dyspnea on exertion, nausea, vomiting, abdominal pain, dysuria, hematuria, melena, numbness, weakness, or tingling.   All other systems have been reviewed and were otherwise negative with the exception of those mentioned in the HPI and as above.    PHYSICAL EXAM: Filed Vitals:   02/09/15 1241  BP: 126/74  Pulse: 86  Temp: 98.2 F (36.8 C)  Resp: 18   There were no vitals filed for this visit. There is no weight on file to calculate BMI.  General: Alert, mild acute distress HEENT:  Normocephalic, atraumatic, oropharynx patent. EOMI, PERRLA Cardiovascular:  Regular rate and rhythm, no rubs murmurs or gallops.  No Carotid bruits, radial pulse intact. No pedal edema.  Respiratory: Clear to auscultation bilaterally.  No wheezes, rales, or rhonchi.  No cyanosis, no use of accessory musculature GI: No organomegaly, abdomen is soft and non-tender, positive bowel sounds.  No masses. Skin: No rashes. Neurologic: Facial musculature symmetric. Psychiatric: Patient is appropriate throughout our interaction. Lymphatic: No cervical lymphadenopathy Musculoskeletal: He is in a wheelchair Left knee-limited exam due to swelling and pain. Knee is significantly swollen. Unable to do a straight leg raise. Sensation are normal in the quadriceps and lower extremity He is able to wiggle his  toes. Posterior tib artery pulse intact   LABS: No results found for this or any previous visit.   EKG/XRAY:   [facility] Left patellar dislocation vs more likely tendon rupture   ASSESSMENT/PLAN: Encounter Diagnoses  Name Primary?  . Lateral knee pain, left   . Left knee injury, initial encounter   . Knee swelling, left   . Rupture of kneecap tendon, left, initial encounter Yes   Pleasant 44 year old gentleman with an acute tendon rupture of the left knee. He was given Flexeril and also Percocet for pain control He was given a knee  immobilizer with crutches. He was advised to be non-weightbearing as much as possible on the left leg. He was referred to orthopedics for evaluation. I would like him to be seen in the next 1-2 days if possible. He is unable to take anti-inflammatories such as ibuprofen since he goes into anaphylaxis.  Please see official x-ray below:  Anterior soft tissue swelling and patella alta, consistent with infrapatellar tendon injury. Ossific density along the infrapatellar tendon is suspicious for an avulsion fracture fragment.   Electronically Signed  By: Myles RosenthalJohn Stahl M.D.  On: 02/09/2015 13:38   Gross sideeffects, risk and benefits, and alternatives of medications d/w patient. Patient is aware that all medications have potential sideeffects and we are unable to predict every sideeffect or drug-drug interaction that may occur.  Hamilton CapriLE, Raquelle Pietro PHUONG, DO 02/09/2015 2:43 PM

## 2015-08-10 ENCOUNTER — Encounter (HOSPITAL_COMMUNITY): Payer: Self-pay | Admitting: Emergency Medicine

## 2015-08-10 ENCOUNTER — Emergency Department (HOSPITAL_COMMUNITY)
Admission: EM | Admit: 2015-08-10 | Discharge: 2015-08-11 | Disposition: A | Discharge status: Home / Self Care | Payer: Self-pay | Attending: Emergency Medicine | Admitting: Emergency Medicine

## 2015-08-10 DIAGNOSIS — R0981 Nasal congestion: Secondary | ICD-10-CM | POA: Insufficient documentation

## 2015-08-10 DIAGNOSIS — R61 Generalized hyperhidrosis: Secondary | ICD-10-CM | POA: Insufficient documentation

## 2015-08-10 DIAGNOSIS — R197 Diarrhea, unspecified: Secondary | ICD-10-CM

## 2015-08-10 DIAGNOSIS — S86812A Strain of other muscle(s) and tendon(s) at lower leg level, left leg, initial encounter: Secondary | ICD-10-CM

## 2015-08-10 DIAGNOSIS — K922 Gastrointestinal hemorrhage, unspecified: Secondary | ICD-10-CM

## 2015-08-10 DIAGNOSIS — Z720 Tobacco use: Secondary | ICD-10-CM | POA: Insufficient documentation

## 2015-08-10 DIAGNOSIS — M25462 Effusion, left knee: Secondary | ICD-10-CM

## 2015-08-10 DIAGNOSIS — R748 Abnormal levels of other serum enzymes: Secondary | ICD-10-CM | POA: Insufficient documentation

## 2015-08-10 DIAGNOSIS — M25562 Pain in left knee: Secondary | ICD-10-CM

## 2015-08-10 DIAGNOSIS — K2970 Gastritis, unspecified, without bleeding: Secondary | ICD-10-CM | POA: Insufficient documentation

## 2015-08-10 DIAGNOSIS — S8992XA Unspecified injury of left lower leg, initial encounter: Secondary | ICD-10-CM

## 2015-08-10 NOTE — ED Provider Notes (Signed)
CSN: [id number]     Arrival date & time 08/10/15  2335 History   By signing my name below, I, Mathew Craig, attest that this documentation has been prepared under the direction and in the presence of Loren Racer, MD. Electronically Signed: Arlan Craig, ED Scribe. 08/11/2015. 12:26 AM.   Chief Complaint  Patient presents with  . Diarrhea   The history is provided by the patient. No language interpreter was used.    HPI Comments: Mathew Craig is a 44 y.o. male who presents to the Emergency Department complaining of constant, ongoing diarrhea x 1 week. Stools described as watery and loose but sometimes dark/black in color. Reports 3-4 episodes daily after eating and drinking. Denies any blood, mucous, or pus in stools. Ongoing burning like abdominal discomfort, night sweats, and loss of appetite also reports. OTC Pepto Bismol attempted prior to arrival without any improvement. Pt sustained knee surgery recently and was on Percocet and Hydrocodone for 5 months. Last dose 3.5 weeks ago. Mathew Craig was then started on Meloxicam after narcotic use. No recent antibiotic use. No recent long distance travel. No known sick contacts.  History reviewed. No pertinent past medical history. Past Surgical History  Procedure Laterality Date  . Knee surgery     No family history on file. Social History  Substance Use Topics  . Smoking status: Current Every Day Smoker -- 0.00 packs/day for 0 years    Types: Cigarettes  . Smokeless tobacco: None  . Alcohol Use: Yes    Review of Systems  Constitutional: Positive for diaphoresis. Negative for fever and chills.  HENT: Positive for congestion.   Respiratory: Negative for cough and shortness of breath.   Cardiovascular: Negative for chest pain.  Gastrointestinal: Positive for abdominal pain and diarrhea. Negative for nausea, vomiting, blood in stool and abdominal distention.  Musculoskeletal: Negative for back pain.  Skin: Negative for rash.   Neurological: Negative for dizziness, weakness, numbness and headaches.  Psychiatric/Behavioral: Negative for confusion.  All other systems reviewed and are negative.     Allergies  Ibuprofen  Home Medications   Prior to Admission medications   Medication Sig Start Date End Date Taking? Authorizing Provider  cyclobenzaprine (FLEXERIL) 10 MG tablet Take 1 tablet (10 mg total) by mouth 3 (three) times daily as needed for muscle spasms. 02/09/15   Thao P Le, DO  loperamide (IMODIUM) 2 MG capsule Take 1 capsule (2 mg total) by mouth 4 (four) times daily as needed for diarrhea or loose stools. 08/11/15   Loren Racer, MD  oxyCODONE-acetaminophen (PERCOCET/ROXICET) 5-325 MG per tablet Take 1-2 tablets by mouth every 6 (six) hours as needed for severe pain. Use with stool softener 02/09/15   Thao P Le, DO  pantoprazole (PROTONIX) 20 MG tablet Take 2 tablets (40 mg total) by mouth daily. 08/11/15   Loren Racer, MD   Triage Vitals: BP 115/72 mmHg  Pulse 55  Temp(Src) 98 F (36.7 C) (Oral)  Resp 16  Ht  (1.778 m)  Wt 205 lb (92.987 kg)  BMI 29.41 kg/m2  SpO2 98%   Physical Exam  Constitutional: He is oriented to person, place, and time. He appears well-developed and well-nourished. No distress.  HENT:  Head: Normocephalic and atraumatic.  Mouth/Throat: Oropharynx is clear and moist.  Eyes: EOM are normal. Pupils are equal, round, and reactive to light.  Neck: Normal range of motion. Neck supple.  Cardiovascular: Normal rate and regular rhythm.   Pulmonary/Chest: Effort normal and breath sounds normal. No  respiratory distress. He has no wheezes. He has no rales. He exhibits no tenderness.  Abdominal: Soft. Bowel sounds are normal. He exhibits no distension and no mass. There is no tenderness. There is no rebound and no guarding.  Musculoskeletal: Normal range of motion. He exhibits no edema or tenderness.  Neurological: He is alert and oriented to person, place, and time.   Skin: Skin is warm and dry. No rash noted. No erythema.  Psychiatric: He has a normal mood and affect. His behavior is normal.  Nursing note and vitals reviewed.   ED Course  Procedures (including critical care time)  DIAGNOSTIC STUDIES: Oxygen Saturation is 99% on RA, Normal by my interpretation.    COORDINATION OF CARE: 12:16 AM-Discussed treatment plan with pt at bedside and pt agreed to plan.     Labs Review Labs Reviewed  CBC WITH DIFFERENTIAL/PLATELET - Abnormal; Notable for the following:    RBC 3.98 (*)    Hemoglobin 12.3 (*)    HCT 36.9 (*)    All other components within normal limits  COMPREHENSIVE METABOLIC PANEL - Abnormal; Notable for the following:    Glucose, Bld 105 (*)    Creatinine, Ser 1.54 (*)    AST 240 (*)    ALT 239 (*)    GFR calc non Af Amer 53 (*)    All other components within normal limits  LIPASE, BLOOD - Abnormal; Notable for the following:    Lipase 122 (*)    All other components within normal limits  POC OCCULT BLOOD, ED - Abnormal; Notable for the following:    Fecal Occult Bld POSITIVE (*)    All other components within normal limits  C DIFFICILE QUICK SCREEN W PCR REFLEX  OCCULT BLOOD X 1 CARD TO LAB, STOOL  GI PATHOGEN PANEL BY PCR, STOOL    Imaging Review US Abdomen Complete  08/11/2015   CLINICAL DATA:  Upper abdominal pain.  Diarrhea and weakness.  EXAM: ULTRASOUND ABDOMEN COMPLETE  COMPARISON:  None.  FINDINGS: Gallbladder: Physiologically distended. No gallstones or wall thickening visualized. No sonographic Murphy sign noted.  Common bile duct: Diameter: 4.5 mm  Liver: No focal lesion identified. Diffusely increased in parenchymal echogenicity.  IVC: No abnormality visualized.  Pancreas: Visualized portion unremarkable, majority obscured.  Spleen: Size and appearance within normal limits.  Right Kidney: Length: 11 cm. Echogenicity within normal limits. No mass or hydronephrosis visualized.  Left Kidney: Length: 10 cm.  Echogenicity within normal limits. No mass or hydronephrosis visualized.  Abdominal aorta: No aneurysm visualized.  Other findings: None.  No visualized ascites.  IMPRESSION: 1. No acute abnormality. 2. Hepatic steatosis.   Electronically Signed   By: Rubye Oaks M.D.   On: 08/11/2015 02:47   I have personally reviewed and evaluated these images and lab results as part of my medical decision-making.   EKG Interpretation None      MDM   Final diagnoses:  Diarrhea  Elevated liver enzymes  Gastritis  Gastrointestinal hemorrhage, unspecified gastritis, unspecified gastrointestinal hemorrhage type    I personally performed the services described in this documentation, which was scribed in my presence. The recorded information has been reviewed and is accurate.   patient admits to significant alcohol intake. Mild elevation in liver function tests as well as lipase. Patient also has a guaiac positive stool study. Concern for alcoholic induced gastritis. Will start on PPI. Vital signs and hemoglobin are stable. Patient advised to decreased alcohol consumption and avoid all NSAIDs. Return precautions have  been given. Pt given GI f/u.   Loren Racer, MD 08/11/15 [phone]

## 2015-08-10 NOTE — ED Notes (Signed)
Pt. reports diarrhea and emesis onset last week , denies fever or abdominal pain .

## 2015-08-11 ENCOUNTER — Emergency Department (HOSPITAL_COMMUNITY): Payer: Private Health Insurance - Indemnity

## 2015-08-11 LAB — POC OCCULT BLOOD, ED: FECAL OCCULT BLD: POSITIVE — AB

## 2015-08-11 LAB — COMPREHENSIVE METABOLIC PANEL
ALBUMIN: 3.9 g/dL (ref 3.5–5.0)
ALK PHOS: 72 U/L (ref 38–126)
ALT: 239 U/L — ABNORMAL HIGH (ref 17–63)
AST: 240 U/L — AB (ref 15–41)
Anion gap: 10 (ref 5–15)
BILIRUBIN TOTAL: 0.6 mg/dL (ref 0.3–1.2)
BUN: 12 mg/dL (ref 6–20)
CO2: 24 mmol/L (ref 22–32)
Calcium: 8.9 mg/dL (ref 8.9–10.3)
Chloride: 103 mmol/L (ref 101–111)
Creatinine, Ser: 1.54 mg/dL — ABNORMAL HIGH (ref 0.61–1.24)
GFR calc Af Amer: >60 mL/min (ref >60)
GFR calc non Af Amer: 53 mL/min — ABNORMAL LOW (ref >60)
GLUCOSE: 105 mg/dL — AB (ref 65–99)
POTASSIUM: 3.5 mmol/L (ref 3.5–5.1)
Sodium: 137 mmol/L (ref 135–145)
TOTAL PROTEIN: 6.5 g/dL (ref 6.5–8.1)

## 2015-08-11 LAB — CBC WITH DIFFERENTIAL/PLATELET
BASOS ABS: 0 10*3/uL (ref 0.0–0.1)
BASOS PCT: 0 %
Eosinophils Absolute: 0.1 10*3/uL (ref 0.0–0.7)
Eosinophils Relative: 2 %
HEMATOCRIT: 36.9 % — AB (ref 39.0–52.0)
HEMOGLOBIN: 12.3 g/dL — AB (ref 13.0–17.0)
Lymphocytes Relative: 48 %
Lymphs Abs: 3.1 10*3/uL (ref 0.7–4.0)
MCH: 30.9 pg (ref 26.0–34.0)
MCHC: 33.3 g/dL (ref 30.0–36.0)
MCV: 92.7 fL (ref 78.0–100.0)
Monocytes Absolute: 0.5 10*3/uL (ref 0.1–1.0)
Monocytes Relative: 8 %
NEUTROS ABS: 2.7 10*3/uL (ref 1.7–7.7)
NEUTROS PCT: 42 %
Platelets: 206 10*3/uL (ref 150–400)
RBC: 3.98 MIL/uL — ABNORMAL LOW (ref 4.22–5.81)
RDW: 12.3 % (ref 11.5–15.5)
WBC: 6.4 10*3/uL (ref 4.0–10.5)

## 2015-08-11 LAB — C DIFFICILE QUICK SCREEN W PCR REFLEX
C DIFFICILE (CDIFF) INTERP: NEGATIVE
C DIFFICILE (CDIFF) TOXIN: NEGATIVE
C DIFFICLE (CDIFF) ANTIGEN: NEGATIVE

## 2015-08-11 LAB — LIPASE, BLOOD: Lipase: 122 U/L — ABNORMAL HIGH (ref 22–51)

## 2015-08-11 MED ORDER — LOPERAMIDE HCL 2 MG PO CAPS: 2.0000 mg | ORAL_CAPSULE | Freq: Four times a day (QID) | ORAL | Status: AC | PRN

## 2015-08-11 MED ORDER — SODIUM CHLORIDE 0.9 % IV BOLUS (SEPSIS)
1000.0000 mL | Freq: Once | INTRAVENOUS | Status: AC
  Administered 2015-08-11: 1000 mL via INTRAVENOUS

## 2015-08-11 MED ORDER — PANTOPRAZOLE SODIUM 20 MG PO TBEC: 40.0000 mg | DELAYED_RELEASE_TABLET | Freq: Every day | ORAL | Status: AC

## 2015-08-11 MED ORDER — LOPERAMIDE HCL 2 MG PO CAPS: 2.0000 mg | ORAL_CAPSULE | Freq: Four times a day (QID) | ORAL | Status: DC | PRN

## 2015-08-11 MED ORDER — LOPERAMIDE HCL 2 MG PO CAPS
4.0000 mg | ORAL_CAPSULE | Freq: Once | ORAL | Status: AC
Start: 2015-08-11 — End: 2015-08-11
  Administered 2015-08-11: 4 mg via ORAL
  Filled 2015-08-11: qty 2

## 2015-08-11 MED ORDER — PANTOPRAZOLE SODIUM 40 MG PO TBEC
40.0000 mg | DELAYED_RELEASE_TABLET | Freq: Once | ORAL | Status: AC
Start: 2015-08-11 — End: 2015-08-11
  Administered 2015-08-11: 40 mg via ORAL
  Filled 2015-08-11: qty 1

## 2015-08-11 MED ORDER — PANTOPRAZOLE SODIUM 20 MG PO TBEC: 40.0000 mg | DELAYED_RELEASE_TABLET | Freq: Every day | ORAL | Status: DC

## 2015-08-11 NOTE — ED Notes (Signed)
Patient transported to Ultrasound 

## 2015-08-11 NOTE — Discharge Instructions (Signed)
Call and make an appointment to follow-up with the gastroenterologist. Return immediately for worsening pain, gross blood in stool, lightheadedness, or for any concerns. Decreased alcohol consumption and avoid all NSAID's such as Ibuprofen.     Gastrointestinal Bleeding Gastrointestinal (GI) bleeding means there is bleeding somewhere along the digestive tract, between the mouth and anus. CAUSES  There are many different problems that can cause GI bleeding. Possible causes include:  Esophagitis. This is inflammation, irritation, or swelling of the esophagus.  Hemorrhoids.These are veins that are full of blood (engorged) in the rectum. They cause pain, inflammation, and may bleed.  Anal fissures.These are areas of painful tearing which may bleed. They are often caused by passing hard stool.  Diverticulosis.These are pouches that form on the colon over time, with age, and may bleed significantly.  Diverticulitis.This is inflammation in areas with diverticulosis. It can cause pain, fever, and bloody stools, although bleeding is rare.  Polyps and cancer. Colon cancer often starts out as precancerous polyps.  Gastritis and ulcers.Bleeding from the upper gastrointestinal tract (near the stomach) may travel through the intestines and produce black, sometimes tarry, often bad smelling stools. In certain cases, if the bleeding is fast enough, the stools may not be black, but red. This condition may be life-threatening. SYMPTOMS   Vomiting bright red blood or material that looks like coffee grounds.  Bloody, black, or tarry stools. DIAGNOSIS  Your caregiver may diagnose your condition by taking your history and performing a physical exam. More tests may be needed, including:  X-rays and other imaging tests.  Esophagogastroduodenoscopy (EGD). This test uses a flexible, lighted tube to look at your esophagus, stomach, and small intestine.  Colonoscopy. This test uses a flexible, lighted  tube to look at your colon. TREATMENT  Treatment depends on the cause of your bleeding.   For bleeding from the esophagus, stomach, small intestine, or colon, the caregiver doing your EGD or colonoscopy may be able to stop the bleeding as part of the procedure.  Inflammation or infection of the colon can be treated with medicines.  Many rectal problems can be treated with creams, suppositories, or warm baths.  Surgery is sometimes needed.  Blood transfusions are sometimes needed if you have lost a lot of blood. If bleeding is slow, you may be allowed to go home. If there is a lot of bleeding, you will need to stay in the hospital for observation. HOME CARE INSTRUCTIONS   Take any medicines exactly as prescribed.  Keep your stools soft by eating foods that are high in fiber. These foods include whole grains, legumes, fruits, and vegetables. Prunes (1 to 3 a day) work well for many people.  Drink enough fluids to keep your urine clear or pale yellow. SEEK IMMEDIATE MEDICAL CARE IF:   Your bleeding increases.  You feel lightheaded, weak, or you faint.  You have severe cramps in your back or abdomen.  You pass large blood clots in your stool.  Your problems are getting worse. MAKE SURE YOU:   Understand these instructions.  Will watch your condition.  Will get help right away if you are not doing well or get worse. Document Released: 10/25/2000 Document Revised: 10/14/2012 Document Reviewed: 10/07/2011 [facility] Patient Information 2015 [place], [place]. This information is not intended to replace advice given to you by your health care provider. Make sure you discuss any questions you have with your health care provider.   Diarrhea Diarrhea is frequent loose and watery bowel movements. It can cause  you to feel weak and dehydrated. Dehydration can cause you to become tired and thirsty, have a dry mouth, and have decreased urination that often is dark yellow. Diarrhea is a sign  of another problem, most often an infection that will not last long. In most cases, diarrhea typically lasts 2-3 days. However, it can last longer if it is a sign of something more serious. It is important to treat your diarrhea as directed by your caregiver to lessen or prevent future episodes of diarrhea. CAUSES  Some common causes include:  Gastrointestinal infections caused by viruses, bacteria, or parasites.  Food poisoning or food allergies.  Certain medicines, such as antibiotics, chemotherapy, and laxatives.  Artificial sweeteners and fructose.  Digestive disorders. HOME CARE INSTRUCTIONS  Ensure adequate fluid intake (hydration): Have 1 cup (8 oz) of fluid for each diarrhea episode. Avoid fluids that contain simple sugars or sports drinks, fruit juices, whole milk products, and sodas. Your urine should be clear or pale yellow if you are drinking enough fluids. Hydrate with an oral rehydration solution that you can purchase at pharmacies, retail stores, and online. You can prepare an oral rehydration solution at home by mixing the following ingredients together:   - tsp table salt.   tsp baking soda.   tsp salt substitute containing potassium chloride.  1  tablespoons sugar.  1 L (34 oz) of water.  Certain foods and beverages may increase the speed at which food moves through the gastrointestinal (GI) tract. These foods and beverages should be avoided and include:  Caffeinated and alcoholic beverages.  High-fiber foods, such as raw fruits and vegetables, nuts, seeds, and whole grain breads and cereals.  Foods and beverages sweetened with sugar alcohols, such as xylitol, sorbitol, and mannitol.  Some foods may be well tolerated and may help thicken stool including:  Starchy foods, such as rice, toast, pasta, low-sugar cereal, oatmeal, grits, baked potatoes, crackers, and bagels.  Bananas.  Applesauce.  Add probiotic-rich foods to help increase healthy bacteria in  the GI tract, such as yogurt and fermented milk products.  Wash your hands well after each diarrhea episode.  Only take over-the-counter or prescription medicines as directed by your caregiver.  Take a warm bath to relieve any burning or pain from frequent diarrhea episodes. SEEK IMMEDIATE MEDICAL CARE IF:   You are unable to keep fluids down.  You have persistent vomiting.  You have blood in your stool, or your stools are black and tarry.  You do not urinate in 6-8 hours, or there is only a small amount of very dark urine.  You have abdominal pain that increases or localizes.  You have weakness, dizziness, confusion, or light-headedness.  You have a severe headache.  Your diarrhea gets worse or does not get better.  You have a fever or persistent symptoms for more than 2-3 days.  You have a fever and your symptoms suddenly get worse. MAKE SURE YOU:   Understand these instructions.  Will watch your condition.  Will get help right away if you are not doing well or get worse. Document Released: 10/18/2002 Document Revised: 03/14/2014 Document Reviewed: 07/05/2012 [facility] Patient Information 2015 [place], [place]. This information is not intended to replace advice given to you by your health care provider. Make sure you discuss any questions you have with your health care provider.  Gastritis, Adult Gastritis is soreness and swelling (inflammation) of the lining of the stomach. Gastritis can develop as a sudden onset (acute) or long-term (chronic) condition. If  gastritis is not treated, it can lead to stomach bleeding and ulcers. CAUSES  Gastritis occurs when the stomach lining is weak or damaged. Digestive juices from the stomach then inflame the weakened stomach lining. The stomach lining may be weak or damaged due to viral or bacterial infections. One common bacterial infection is the Helicobacter pylori infection. Gastritis can also result from excessive alcohol consumption,  taking certain medicines, or having too much acid in the stomach.  SYMPTOMS  In some cases, there are no symptoms. When symptoms are present, they may include:  Pain or a burning sensation in the upper abdomen.  Nausea.  Vomiting.  An uncomfortable feeling of fullness after eating. DIAGNOSIS  Your caregiver may suspect you have gastritis based on your symptoms and a physical exam. To determine the cause of your gastritis, your caregiver may perform the following:  Blood or stool tests to check for the H pylori bacterium.  Gastroscopy. A thin, flexible tube (endoscope) is passed down the esophagus and into the stomach. The endoscope has a light and camera on the end. Your caregiver uses the endoscope to view the inside of the stomach.  Taking a tissue sample (biopsy) from the stomach to examine under a microscope. TREATMENT  Depending on the cause of your gastritis, medicines may be prescribed. If you have a bacterial infection, such as an H pylori infection, antibiotics may be given. If your gastritis is caused by too much acid in the stomach, H2 blockers or antacids may be given. Your caregiver may recommend that you stop taking aspirin, ibuprofen, or other nonsteroidal anti-inflammatory drugs (NSAIDs). HOME CARE INSTRUCTIONS  Only take over-the-counter or prescription medicines as directed by your caregiver.  If you were given antibiotic medicines, take them as directed. Finish them even if you start to feel better.  Drink enough fluids to keep your urine clear or pale yellow.  Avoid foods and drinks that make your symptoms worse, such as:  Caffeine or alcoholic drinks.  Chocolate.  Peppermint or mint flavorings.  Garlic and onions.  Spicy foods.  Citrus fruits, such as oranges, lemons, or limes.  Tomato-based foods such as sauce, chili, salsa, and pizza.  Fried and fatty foods.  Eat small, frequent meals instead of large meals. SEEK IMMEDIATE MEDICAL CARE IF:    You have black or dark red stools.  You vomit blood or material that looks like coffee grounds.  You are unable to keep fluids down.  Your abdominal pain gets worse.  You have a fever.  You do not feel better after 1 week.  You have any other questions or concerns. MAKE SURE YOU:  Understand these instructions.  Will watch your condition.  Will get help right away if you are not doing well or get worse. Document Released: 10/22/2001 Document Revised: 04/28/2012 Document Reviewed: 12/11/2011 [facility] Patient Information 2015 [place], [place]. This information is not intended to replace advice given to you by your health care provider. Make sure you discuss any questions you have with your health care provider.

## 2015-08-15 LAB — GI PATHOGEN PANEL BY PCR, STOOL
C DIFFICILE TOXIN A/B: NOT DETECTED
CRYPTOSPORIDIUM BY PCR: NOT DETECTED
Campylobacter by PCR: NOT DETECTED
E COLI (ETEC) LT/ST: NOT DETECTED
E coli (STEC): NOT DETECTED
E coli 0157 by PCR: NOT DETECTED
G LAMBLIA BY PCR: NOT DETECTED
Norovirus GI/GII: NOT DETECTED
Rotavirus A by PCR: NOT DETECTED
SALMONELLA BY PCR: NOT DETECTED
SHIGELLA BY PCR: NOT DETECTED

## 2015-12-10 IMAGING — CR DG KNEE 1-2V*L*
3 series · 3 of 3 positions shown · non-contrast
Comparison: None.

CLINICAL DATA: Fall at work. Left lateral knee pain and swelling.
Initial encounter.

EXAM:
LEFT KNEE - 1-2 VIEW

[AP]
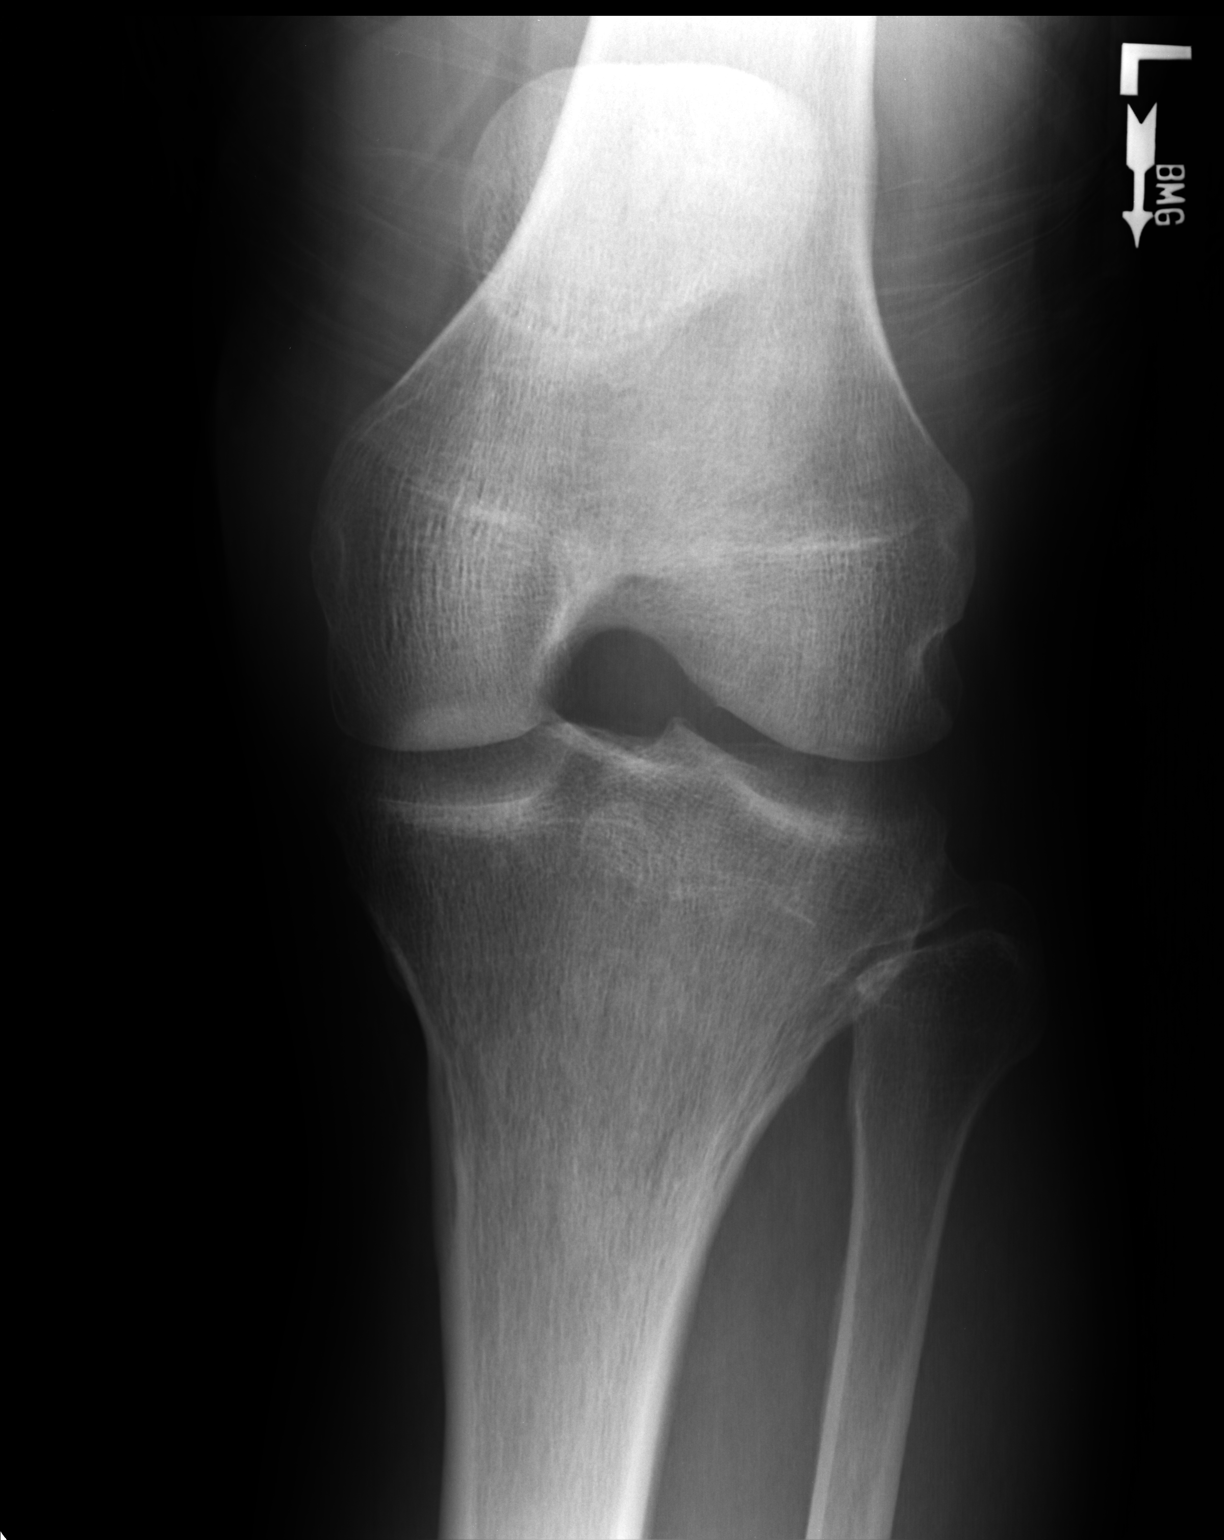

[lateral]
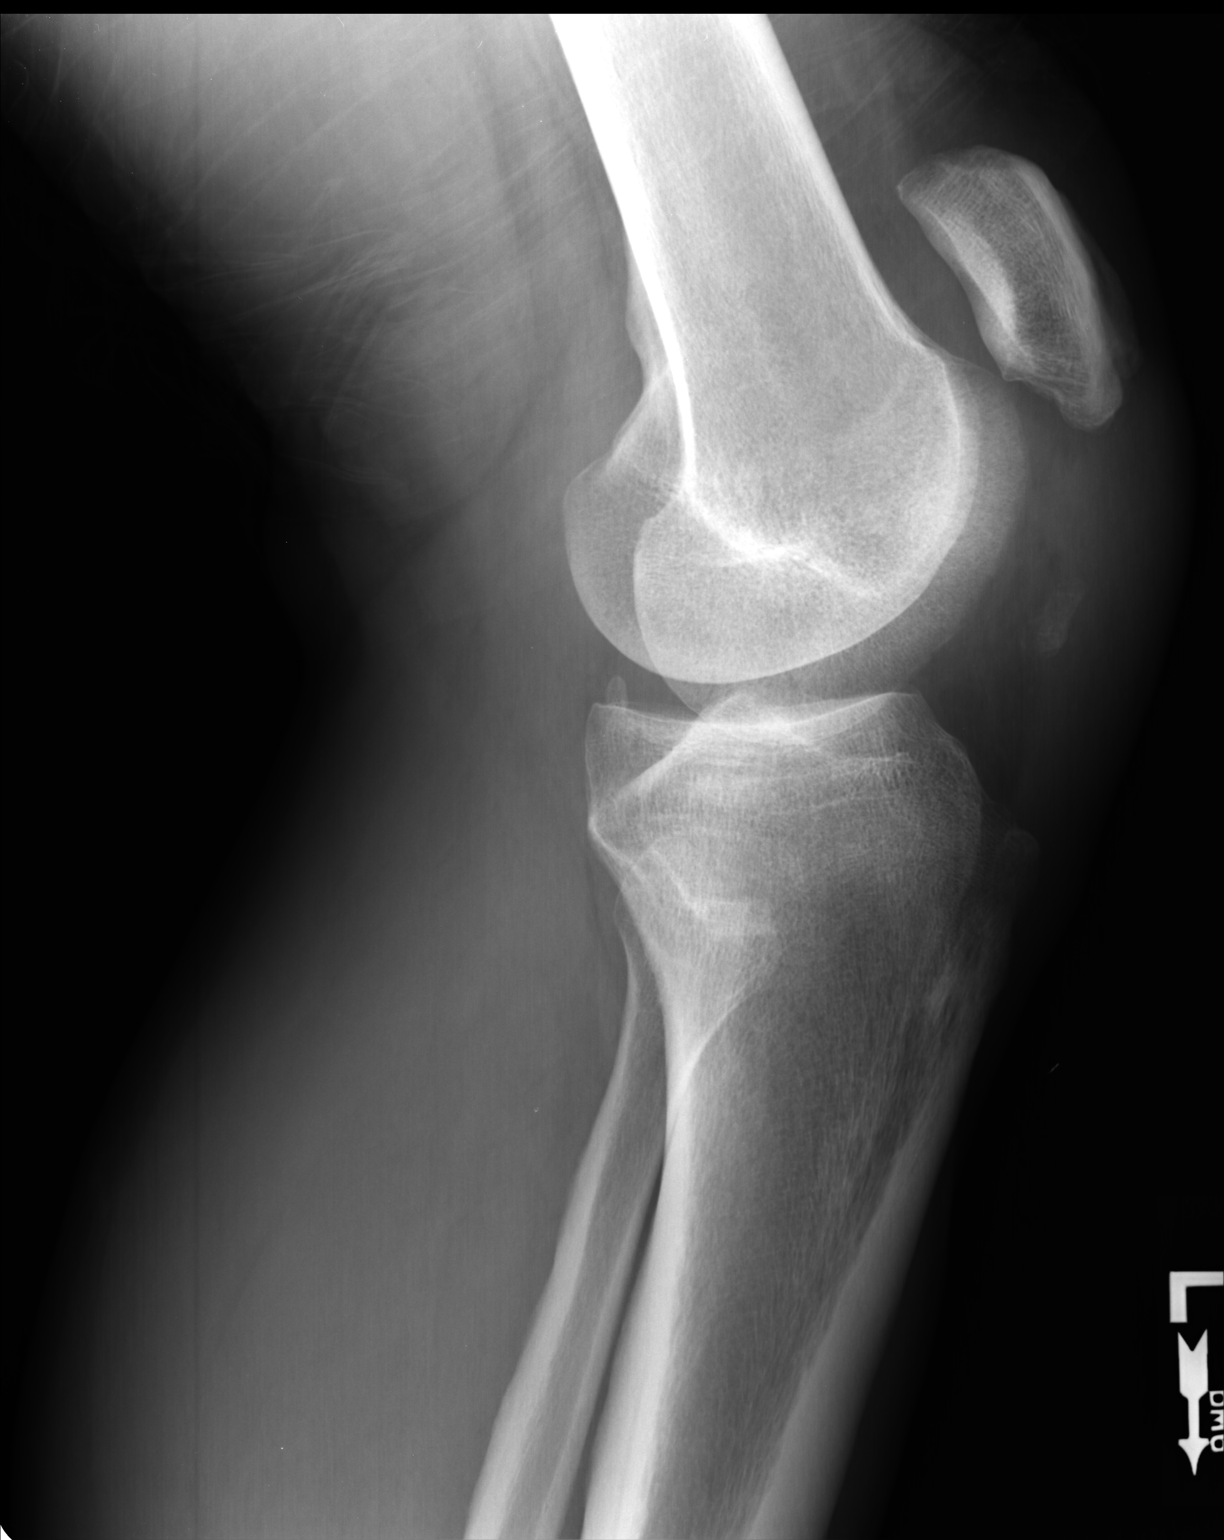

[ap axial]
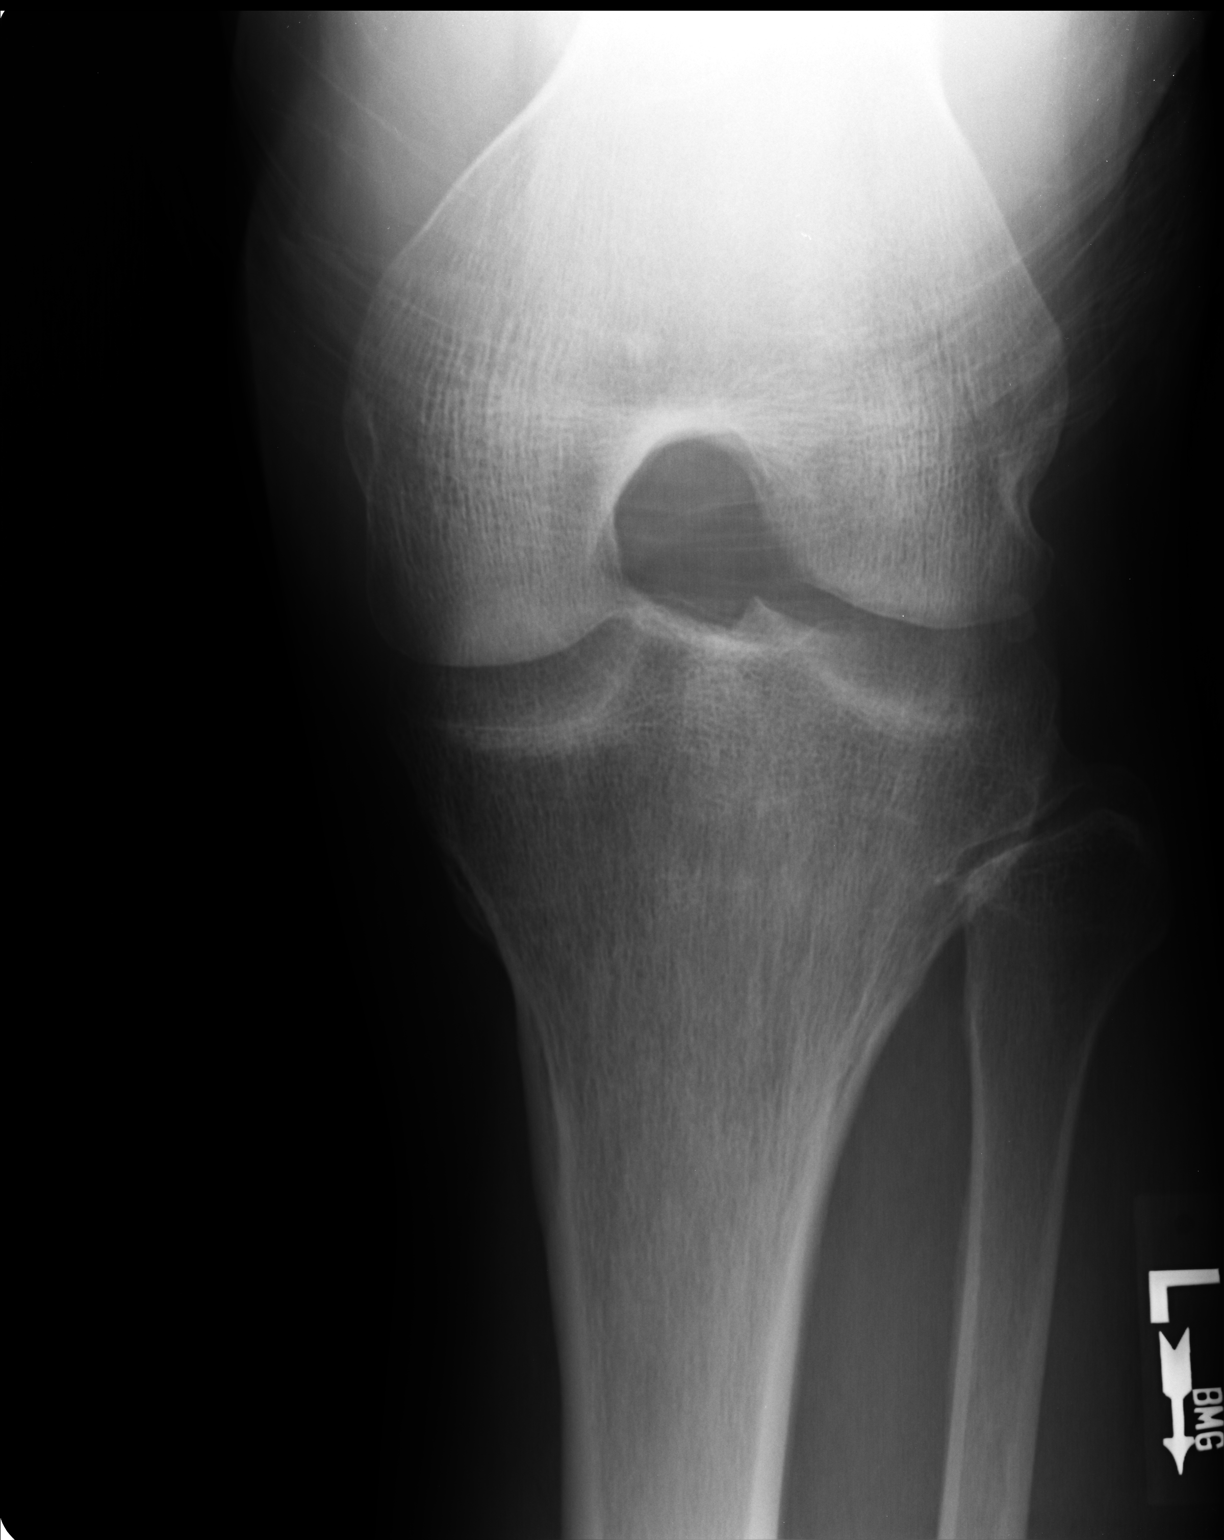

[3 of 3 positions shown; findings below may reference images not displayed]

FINDINGS: Patella Alta is demonstrated with associated infrapatellar soft
tissue swelling. This is consistent with infrapatellar tendon
injury. Ossific density is seen along the infrapatellar tendon
between the patella and anterior tibial tubercle, suspicious for
small avulsion fracture fragment. No other fractures are identified.
No definite evidence of knee joint effusion.
IMPRESSION: Anterior soft tissue swelling and patella alta, consistent with
infrapatellar tendon injury. Ossific density along the infrapatellar
tendon is suspicious for an avulsion fracture fragment.

## 2017-09-16 IMAGING — US US ABDOMEN COMPLETE
1 series · 14 of 25 positions shown · non-contrast
Comparison: None.

CLINICAL DATA: Upper abdominal pain.  Diarrhea and weakness.

EXAM:
ULTRASOUND ABDOMEN COMPLETE

[Series 1: us abdomen complete · 0.22mm/px · 14 of 63 slices shown]
[im 1/63]
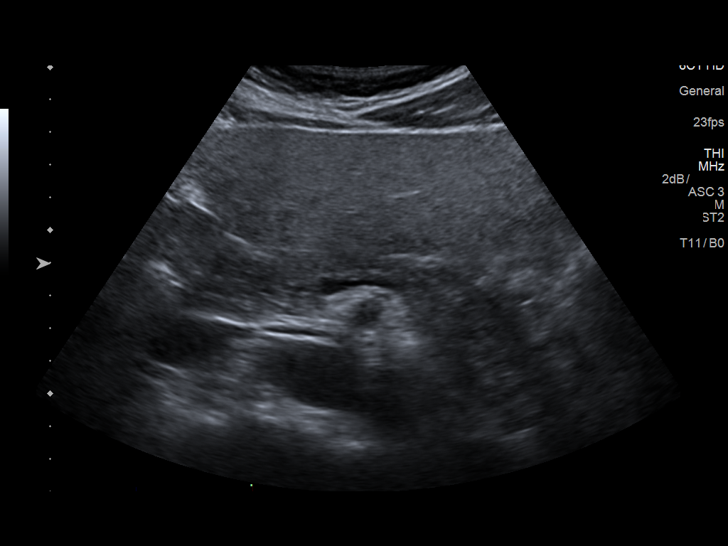
[im 6/63]
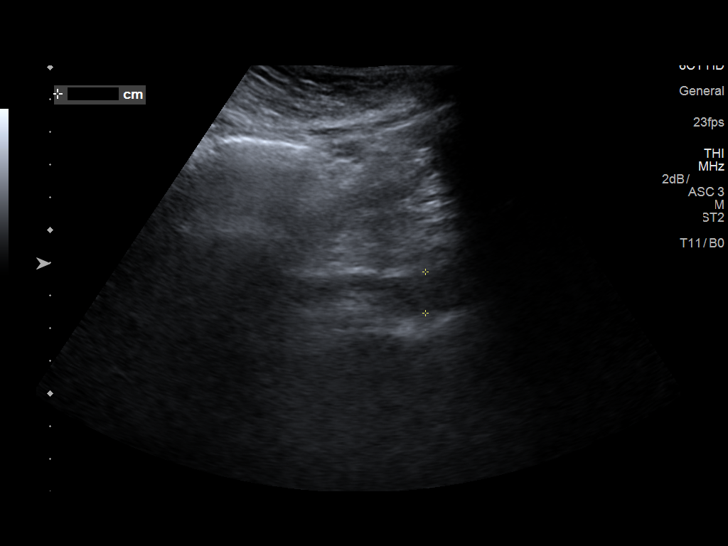
[im 11/63]
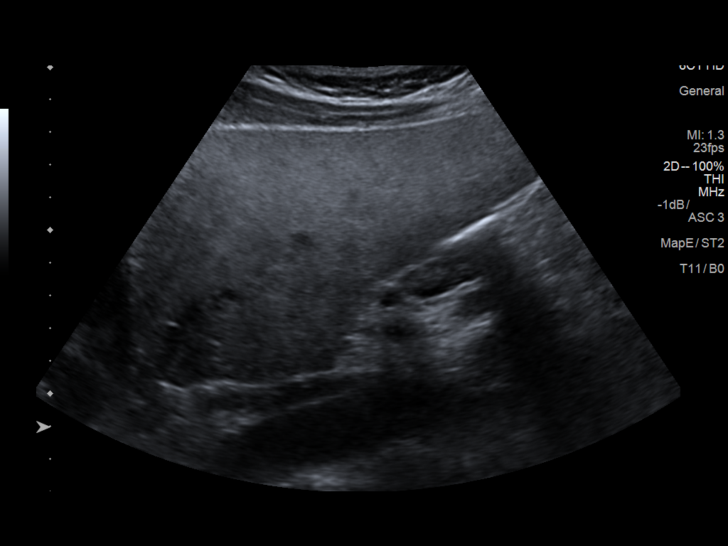
[im 16/63]
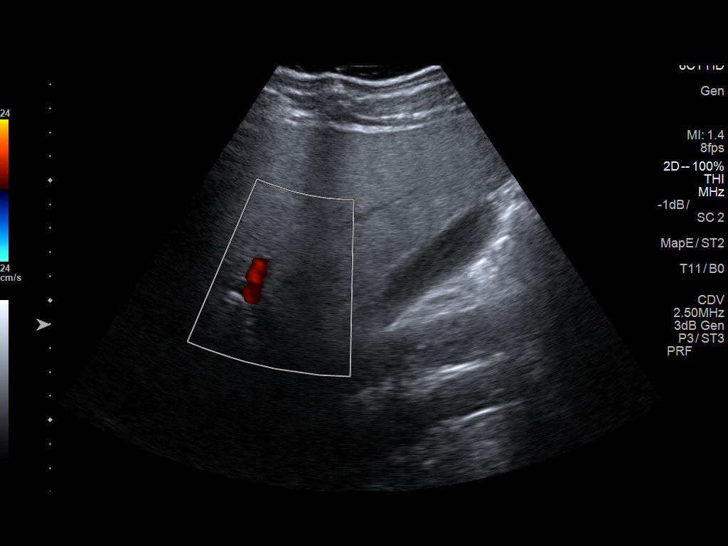
[im 21/63]
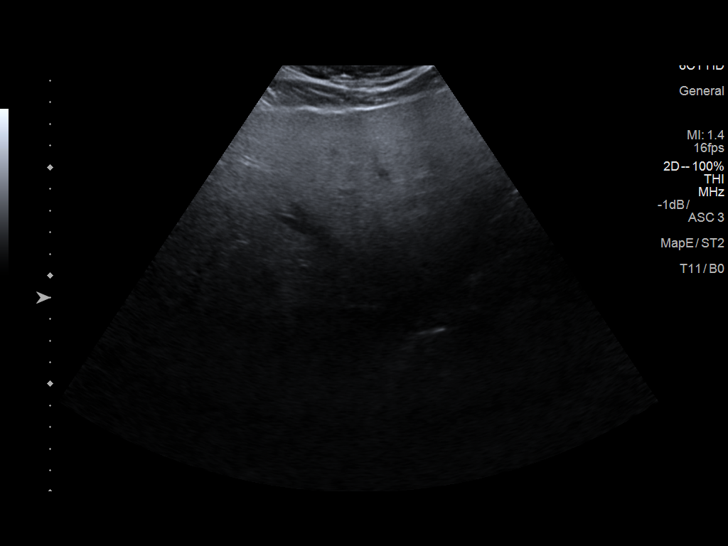
[im 24/63]
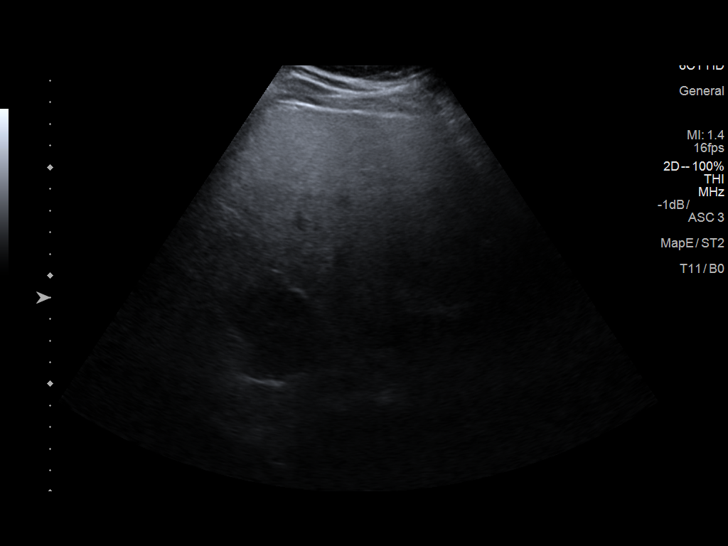
[im 29/63]
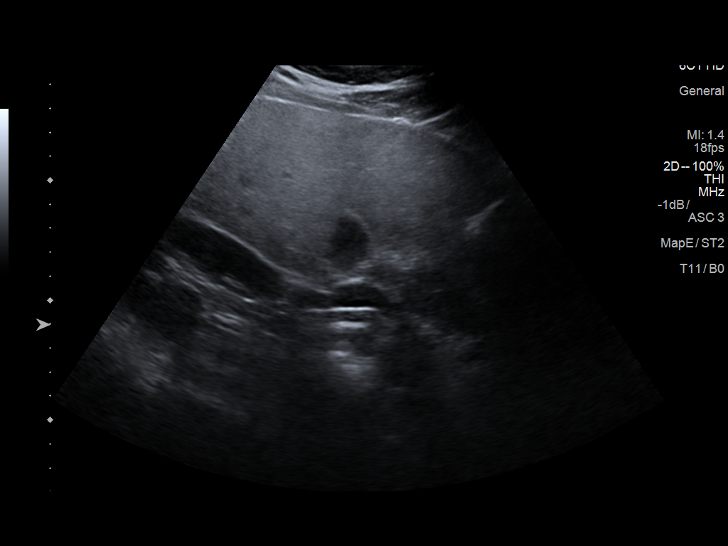
[im 34/63]
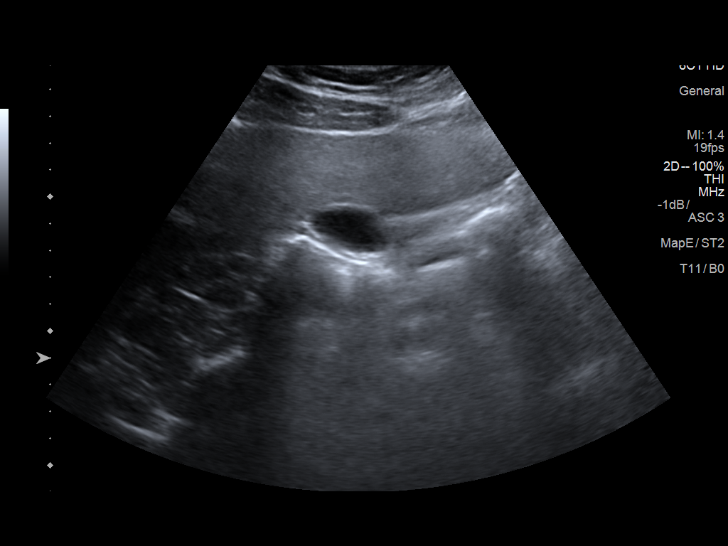
[im 39/63]
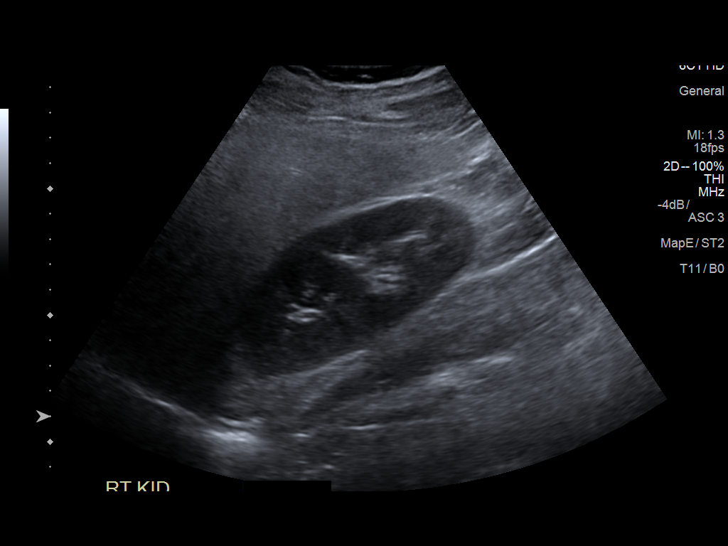
[im 42/63]
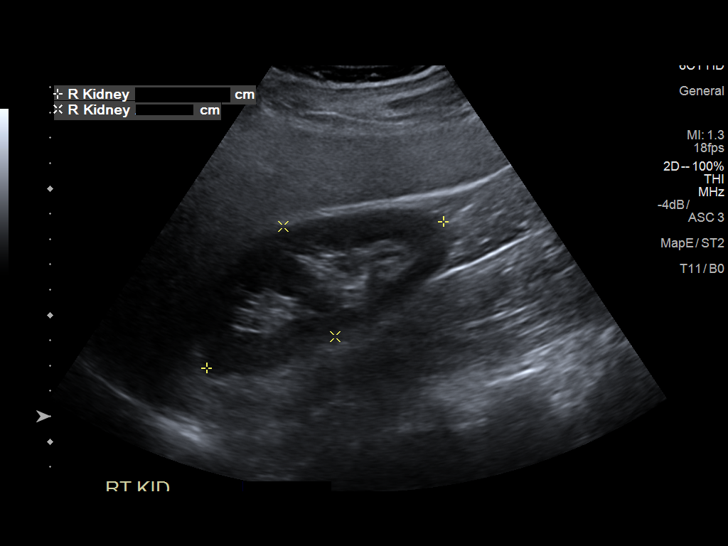
[im 47/63]
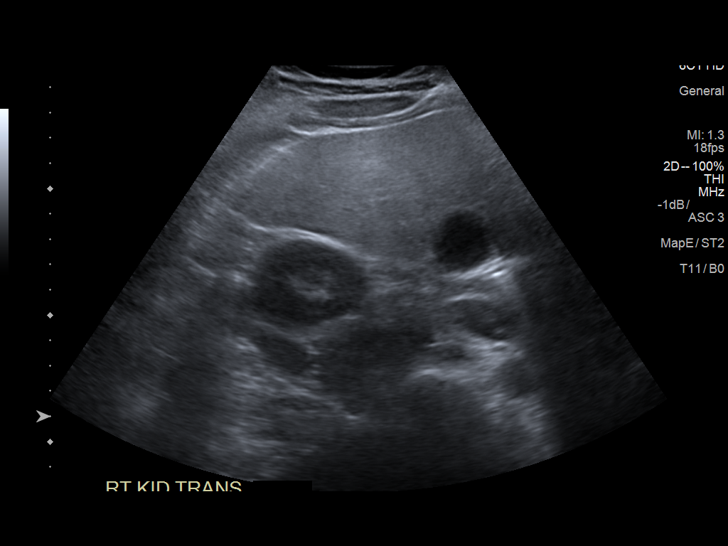
[im 52/63]
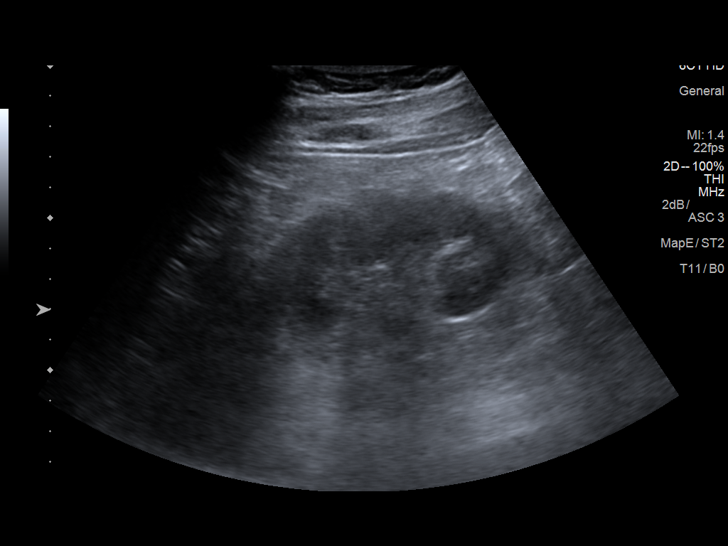
[im 57/63]
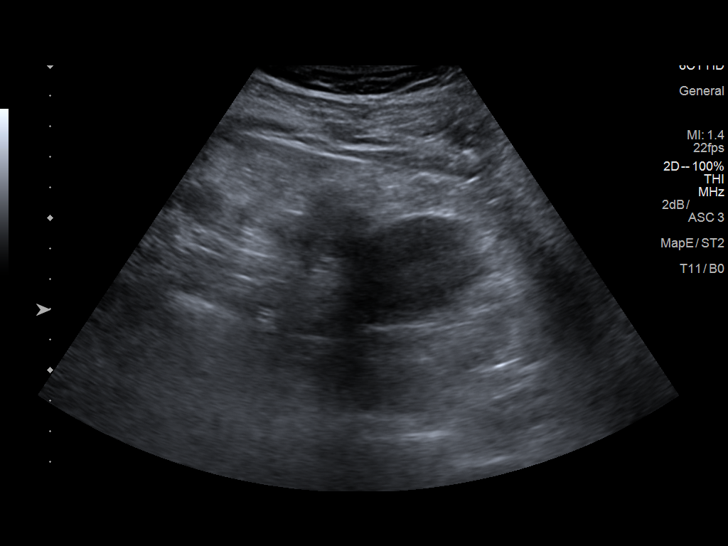
[im 63/63]
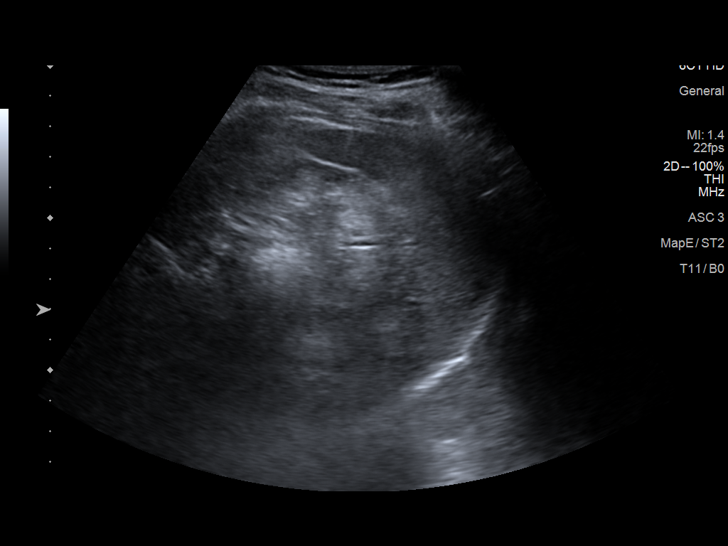

[14 of 25 positions shown; findings below may reference images not displayed]

FINDINGS: Gallbladder: Physiologically distended. No gallstones or wall
thickening visualized. No sonographic Murphy sign noted.

Common bile duct: Diameter: 4.5 mm

Liver: No focal lesion identified. Diffusely increased in
parenchymal echogenicity.

IVC: No abnormality visualized.

Pancreas: Visualized portion unremarkable, majority obscured.

Spleen: Size and appearance within normal limits.

Right Kidney: Length: 11 cm. Echogenicity within normal limits. No
mass or hydronephrosis visualized.

Left Kidney: Length: 10 cm. Echogenicity within normal limits. No
mass or hydronephrosis visualized.

Abdominal aorta: No aneurysm visualized.

Other findings: None.  No visualized ascites.
IMPRESSION: 1. No acute abnormality.
2. Hepatic steatosis.
